# Patient Record
Sex: Female | Born: 1955 | Race: White | Hispanic: No | Smoking: Former smoker
Health system: Southern US, Community
[De-identification: ages and names within clinical notes are randomized; demographics above are authoritative.]

## PROBLEM LIST (undated history)

## (undated) DIAGNOSIS — I10 Essential (primary) hypertension: Secondary | ICD-10-CM

## (undated) DIAGNOSIS — R519 Headache, unspecified: Secondary | ICD-10-CM

## (undated) DIAGNOSIS — F419 Anxiety disorder, unspecified: Secondary | ICD-10-CM

## (undated) DIAGNOSIS — T7840XA Allergy, unspecified, initial encounter: Secondary | ICD-10-CM

## (undated) DIAGNOSIS — M810 Age-related osteoporosis without current pathological fracture: Secondary | ICD-10-CM

## (undated) DIAGNOSIS — F329 Major depressive disorder, single episode, unspecified: Secondary | ICD-10-CM

## (undated) DIAGNOSIS — M1990 Unspecified osteoarthritis, unspecified site: Secondary | ICD-10-CM

## (undated) DIAGNOSIS — E785 Hyperlipidemia, unspecified: Secondary | ICD-10-CM

## (undated) DIAGNOSIS — F32A Depression, unspecified: Secondary | ICD-10-CM

## (undated) HISTORY — DX: Headache, unspecified: R51.9

## (undated) HISTORY — DX: Depression, unspecified: F32.A

## (undated) HISTORY — PX: CEREBRAL MICROVASCULAR DECOMPRESSION: SHX1328

## (undated) HISTORY — PX: TRIGEMINAL NERVE DECOMPRESSION: SHX2579

## (undated) HISTORY — DX: Unspecified osteoarthritis, unspecified site: M19.90

## (undated) HISTORY — DX: Major depressive disorder, single episode, unspecified: F32.9

## (undated) HISTORY — PX: BRAIN SURGERY: SHX531

## (undated) HISTORY — PX: FOOT SURGERY: SHX648

## (undated) HISTORY — DX: Anxiety disorder, unspecified: F41.9

## (undated) HISTORY — DX: Hyperlipidemia, unspecified: E78.5

## (undated) HISTORY — DX: Age-related osteoporosis without current pathological fracture: M81.0

## (undated) HISTORY — DX: Essential (primary) hypertension: I10

## (undated) HISTORY — DX: Allergy, unspecified, initial encounter: T78.40XA

---

## 1999-01-28 ENCOUNTER — Other Ambulatory Visit: Admission: RE | Admit: 1999-01-28 | Discharge: 1999-01-28 | Payer: Self-pay | Admitting: Gynecology

## 2000-04-23 ENCOUNTER — Other Ambulatory Visit: Admission: RE | Admit: 2000-04-23 | Discharge: 2000-04-23 | Payer: Self-pay | Admitting: Gynecology

## 2001-01-01 ENCOUNTER — Ambulatory Visit (HOSPITAL_COMMUNITY): Admission: RE | Admit: 2001-01-01 | Discharge: 2001-01-01 | Payer: Self-pay | Admitting: Gynecology

## 2001-01-01 ENCOUNTER — Encounter (INDEPENDENT_AMBULATORY_CARE_PROVIDER_SITE_OTHER): Payer: Self-pay

## 2002-04-18 ENCOUNTER — Other Ambulatory Visit: Admission: RE | Admit: 2002-04-18 | Discharge: 2002-04-18 | Payer: Self-pay | Admitting: Gynecology

## 2003-04-25 ENCOUNTER — Other Ambulatory Visit: Admission: RE | Admit: 2003-04-25 | Discharge: 2003-04-25 | Payer: Self-pay | Admitting: Gynecology

## 2004-05-20 ENCOUNTER — Other Ambulatory Visit: Admission: RE | Admit: 2004-05-20 | Discharge: 2004-05-20 | Payer: Self-pay | Admitting: Gynecology

## 2004-06-13 ENCOUNTER — Ambulatory Visit (HOSPITAL_BASED_OUTPATIENT_CLINIC_OR_DEPARTMENT_OTHER): Admission: RE | Admit: 2004-06-13 | Discharge: 2004-06-13 | Payer: Self-pay | Admitting: Orthopedic Surgery

## 2009-05-13 ENCOUNTER — Encounter: Payer: Self-pay | Admitting: Internal Medicine

## 2009-05-25 ENCOUNTER — Ambulatory Visit: Payer: Self-pay | Admitting: Internal Medicine

## 2009-06-05 ENCOUNTER — Ambulatory Visit: Payer: Self-pay | Admitting: Internal Medicine

## 2009-06-05 HISTORY — PX: COLONOSCOPY: SHX174

## 2010-12-20 NOTE — Op Note (Signed)
[facility]  Patient:    Caitlyn Hill, Caitlyn Hill                           MRN: [id number]. Date: 01/01/01 Attending:  Luvenia Redden, M.D.                           Operative Report  PREOPERATIVE DIAGNOSIS:       Menometrorrhagia.  POSTOPERATIVE DIAGNOSES:      1. Menometrorrhagia.                               2. Endometrial polyp.                               3. Intracavitary myoma.  OPERATION:                    Hysteroscopy, dilation and curettage.  SURGEON:                      Luvenia Redden, M.D.  DESCRIPTION OF PROCEDURE:     Under good anesthesia, the patient was prepped and draped in a sterile manner.  The bladder was catheterized.  The uterus was anterior, upper limits of normal size, slightly irregular, no adnexal masses. The cervix was grasped with a tenaculum and the uterus sounded to a depth of 3-1/2 inches.  The cervix was dilated and the hysteroscope placed in the canal.  Using Sorbitol as a distending medium, the canal was examined and appeared to be normal.  On entering the endometrial cavity, an intracavitary mass could be seen.  It was smooth and round.  Both ostia were viewed once the scope could be passed beyond this mass that looked like a myoma.  There was blood clot superior to the myoma in the uterus.  The fundus and both ostia were viewed and appeared normal.  The myoma appeared to be broad based and was seen to be arising from the anterolateral wall on the patients left side. The scope was then removed.  Endocervical curettage was done, and this was sent as a separate specimen.  The cavity was explored with polyp forceps. Small amounts of tissue were obtained.  The endometrial cavity was scraped vigorously.  A small amount of tissue was obtained.  The cavity was wiped with a dry sponge and the procedure terminated.  Resection of the myoma was not attempted at this time due to its size and, also, due to the fact that she  had not been prepared with Lupron preoperatively.  Blood loss was approximately 25-30 cc.  Fluid deficit of Sorbitol was 30 cc.  The patient tolerated the procedure well and was removed to recovery in good condition. DD:  01/01/01 TD:  01/01/01 Job: [id number]

## 2010-12-20 NOTE — Op Note (Signed)
Caitlyn Hill, Caitlyn Hill                  ACCOUNT NO.:  [id number]   MEDICAL RECORD NO.:  [id number]          PATIENT TYPE:  AMB   LOCATION:  DSC                          FACILITY:  [facility]   PHYSICIAN:  Nadara Mustard, MD     DATE OF BIRTH:  Jun 02, 1956   DATE OF PROCEDURE:  06/13/2004  DATE OF DISCHARGE:                                 OPERATIVE REPORT   PREOPERATIVE DIAGNOSIS:  Bunionette deformity right fifth toe.   POSTOPERATIVE DIAGNOSIS:  Bunionette deformity right fifth toe.   PROCEDURE:  Chevron osteotomy right fifth metatarsal.   SURGEON:  Nadara Mustard, M.D.   ANESTHESIA:  LMA.   ESTIMATED BLOOD LOSS:  Minimal.   ANTIBIOTICS:  1 g of Kefzol.   TOURNIQUET TIME:  Esmarch to the ankle for approximately 18 minutes.   DISPOSITION:  To PACU in stable condition.   INDICATIONS FOR PROCEDURE:  The patient is a 55 year old woman with a  painful bunionette deformity of the right fifth toe.  The patient has failed  conservative care and presents at this time for surgical intervention.  The  risks and benefits were discussed, including infection, neurovascular  injury, persistent pain, need for additional surgery.  The patient states  she understands and wishes to proceed at this time.   DESCRIPTION OF PROCEDURE:  The patient was brought to O.R. room 2 and  underwent a general anesthetic.  After an adequate level of anesthesia was  obtained, the patient's right lower extremity was prepped using DuraPrep and  draped into a sterile field.  The leg was elevated.  An Esmarch was wrapped  around the ankle for tourniquet control.  A lateral longitudinal incision  was made over the MTP joint of the right fifth toe.  This was carried down  through the retinaculum, and an ostectomy was performed.  The patient then  underwent a Chevron osteotomy.  The metatarsal head was transferred  medially, and the osteotomy was then secured with 0.045 K-wire from proximal  dorsal to plantar distally.  A  second ostectomy was then again performed.  The wound was irrigated with normal saline.  The patient had had good  movement of the toe, and the osteotomy site was stable.  The fascia was  closed using 2-0 Vicryl.  The skin was closed using 3-0 nylon.  The wound  was covered with Adaptic orthopedic sponges,  sterile Webril, and a Coban dressing.  The patient was extubated and taken  to the PACU in stable condition.  Plan for discharge to home, touch-down  weightbearing with a postoperative shoe on the right.  Follow up in the  office in two weeks.      Vernia Buff   MVD/MEDQ  D:  06/13/2004  T:  06/13/2004  Job:  [id number]

## 2012-01-26 ENCOUNTER — Other Ambulatory Visit: Payer: Self-pay | Admitting: Internal Medicine

## 2012-01-26 NOTE — Telephone Encounter (Signed)
Dr. Merla Riches,  On 07/08/2011 you wrote this with RF x 1 year, knowing that it'd get capped at 6 months.  I suspect that you want to do the RF for the remaining, rather than me do just 30 days.

## 2012-01-27 NOTE — Telephone Encounter (Signed)
Yes that's the plan

## 2012-01-27 NOTE — Telephone Encounter (Signed)
Rx faxed in to pharmacy.

## 2012-07-28 ENCOUNTER — Other Ambulatory Visit: Payer: Self-pay | Admitting: Internal Medicine

## 2012-08-11 ENCOUNTER — Other Ambulatory Visit: Payer: Self-pay | Admitting: Internal Medicine

## 2012-08-12 NOTE — Telephone Encounter (Signed)
[id number] at nurse's station in [place].

## 2012-08-16 ENCOUNTER — Telehealth: Payer: Self-pay

## 2012-08-16 NOTE — Telephone Encounter (Signed)
Pharmacy wanted to see if pt can get a refill on pts Clonazepam 1 mg tablets. Please advise

## 2012-08-17 NOTE — Telephone Encounter (Signed)
No ov in epic/must have been > than 1 year so needs f/u unless extenuating circumstances

## 2012-08-17 NOTE — Telephone Encounter (Signed)
Called patient to advise she is due for follow up. Left mssg for her.

## 2012-08-18 ENCOUNTER — Telehealth: Payer: Self-pay

## 2012-08-18 NOTE — Telephone Encounter (Signed)
PT WAS TOLD [facility] WANTED HER TO COME BACK IN BEFORE HE WOULD REFILL HER KLONOPIN.  SHE WANTS TO KNOW IF HE WILL REFILL IT UNTIL "SHE GETS OBAMACARE."  [phone]

## 2012-08-21 NOTE — Telephone Encounter (Signed)
Can't/no OV w/in 1 year

## 2012-08-22 NOTE — Telephone Encounter (Signed)
Called pt, LMOM we cannot send in RX per Dr Merla Riches. CB if any questions.

## 2012-10-31 ENCOUNTER — Ambulatory Visit: Payer: Self-pay | Admitting: Internal Medicine

## 2012-10-31 VITALS — BP 130/79 | HR 72 | Temp 98.2°F | Resp 16 | Ht 67.0 in | Wt 140.0 lb

## 2012-10-31 DIAGNOSIS — R05 Cough: Secondary | ICD-10-CM

## 2012-10-31 DIAGNOSIS — F17200 Nicotine dependence, unspecified, uncomplicated: Secondary | ICD-10-CM | POA: Insufficient documentation

## 2012-10-31 DIAGNOSIS — R251 Tremor, unspecified: Secondary | ICD-10-CM | POA: Insufficient documentation

## 2012-10-31 DIAGNOSIS — F411 Generalized anxiety disorder: Secondary | ICD-10-CM | POA: Insufficient documentation

## 2012-10-31 DIAGNOSIS — G4700 Insomnia, unspecified: Secondary | ICD-10-CM

## 2012-10-31 DIAGNOSIS — Z8619 Personal history of other infectious and parasitic diseases: Secondary | ICD-10-CM | POA: Insufficient documentation

## 2012-10-31 DIAGNOSIS — R059 Cough, unspecified: Secondary | ICD-10-CM

## 2012-10-31 DIAGNOSIS — M19049 Primary osteoarthritis, unspecified hand: Secondary | ICD-10-CM | POA: Insufficient documentation

## 2012-10-31 MED ORDER — AZITHROMYCIN 250 MG PO TABS: ORAL_TABLET | ORAL | Status: DC

## 2012-10-31 MED ORDER — CLONAZEPAM 1 MG PO TABS: 1.0000 mg | ORAL_TABLET | Freq: Every evening | ORAL | Status: DC | PRN

## 2012-10-31 MED ORDER — PREDNISONE 20 MG PO TABS: ORAL_TABLET | ORAL | Status: DC

## 2012-10-31 NOTE — Progress Notes (Signed)
  Subjective:    Patient ID: Caitlyn Hill, female    DOB: September 22, 1955, 57 y.o.   MRN: [id number]  HPI she has had congestion for over a month with sinus drainage and cough particularly in the night and early morning No fever No recent weight loss  Also complaining of continued problems with insomnia secondary to anxiety C. long history in old chart/ has done well recently except for insomnia Has done well in the past with Klonopin//has been on Prozac for a year doing okay Continues to work cleaning houses  Continue smoking Continues to work on stress level  Past medical history-history of hepatitis C  Review of Systems No fever chills or night sweats No weight loss Chest pain or palpitations No GI symptoms    Objective:   Physical Exam Vital signs stable TMs clear Nares boggy with purulent mucus Throat clear No nodes Chest clear with mild wheezing on forced expiration Heart regular without murmur       Assessment & Plan:  Problem #1 sinusitis Problem #2 inflammatory cough Problem #3 insomnia secondary to anxiety Problem 4 history of tremor  Meds ordered this encounter  Medications  . azithromycin (ZITHROMAX) 250 MG tablet    Sig: As packaged    Dispense:  6 tablet    Refill:  0  . predniSONE (DELTASONE) 20 MG tablet    Sig: 3/3/2/2/1/1 single daily dose for 6 days    Dispense:  12 tablet    Refill:  0  . clonazePAM (KLONOPIN) 1 MG tablet    Sig: Take 1 tablet (1 mg total) by mouth at bedtime as needed for anxiety.    Dispense:  30 tablet    Refill:  5   Needs Government insurance May need followup hepatitis C

## 2012-10-31 NOTE — Progress Notes (Signed)
  Subjective:    Patient ID: Caitlyn Hill, female    DOB: Dec 25, 1955, 57 y.o.   MRN: [id number]  HPI  57 year old female c/o sinus infection x 1 month began with congestion and headaches, no problem breathing at night, cough does not create sob when moving.  laughing and blowing nose makes coughing worse. No history of wheezing. No penicillin allergy.  Has used z-pack in past with success.   Has trouble moving joints in in mornings in right hand. Takes ibuprofen for pain.    Review of Systems     Objective:   Physical Exam        Assessment & Plan:

## 2012-11-08 ENCOUNTER — Telehealth: Payer: Self-pay

## 2012-11-08 MED ORDER — CEFDINIR 300 MG PO CAPS: 300.0000 mg | ORAL_CAPSULE | Freq: Two times a day (BID) | ORAL | Status: DC

## 2012-11-08 NOTE — Telephone Encounter (Signed)
Still c/o sinuses, please advise, took z pack

## 2012-11-08 NOTE — Telephone Encounter (Signed)
Okay to treat with Omnicef Meds ordered this encounter  Medications  . cefdinir (OMNICEF) 300 MG capsule    Sig: Take 1 capsule (300 mg total) by mouth 2 (two) times daily.    Dispense:  20 capsule    Refill:  0

## 2012-11-08 NOTE — Telephone Encounter (Signed)
Called her, to advise this is sent in for her, and to try Mucinex and Zyrtec (or Claritin)

## 2012-11-08 NOTE — Telephone Encounter (Signed)
Patient was in with a sinus infection and is not better after taking her antibiotic would like to know if we could call in something else for her  [phone]

## 2012-11-10 ENCOUNTER — Telehealth: Payer: Self-pay | Admitting: Radiology

## 2012-11-10 ENCOUNTER — Telehealth: Payer: Self-pay | Admitting: Family Medicine

## 2012-11-10 MED ORDER — LEVOFLOXACIN 750 MG PO TABS: 750.0000 mg | ORAL_TABLET | Freq: Every day | ORAL | Status: DC

## 2012-11-10 NOTE — Telephone Encounter (Signed)
PATIENT STATES THAT SHE WAS PRESCRIBED AN ANTIBIOTIC THAT IS $86. WANTS TO KNOW IF THERE IS A CHEAPER ONE SHE CAN GET. PATIENT USES [facility]. [phone]

## 2012-11-10 NOTE — Telephone Encounter (Signed)
Patient advised another antibiotic has been sent in for her.

## 2012-11-10 NOTE — Telephone Encounter (Signed)
Patient called because the most current abx  (Levaquin) we sent in for her is over a hundred dollars and can not afford this. Looked through OV and previous messages. She was seen 10/31/12 by Dr. Merla Riches  And dx with sinusitis. Was treated with Zpak and prednisone. She called 11/08/12 stating not better and wanted another abx. We sent in Jackson. She called because that was 80 some dollars and could not afford. She requested a different abx. We sent in Levaquin and that is even more expensive. Still having HA, sinus pain, and pressure. She did have improvement after completing 1st abx but then 2 days after she finished her sxs started back. She has been using mucinex and zyrtec as directed. She would like a cheaper abx sent in. She said even if its the same one she was given at first. She just wants another round of abx but a cheap one. Please advise. Ph# [phone]

## 2012-11-10 NOTE — Telephone Encounter (Signed)
Sent Levaquin to pharmacy, hopefully this is less expensive

## 2012-11-10 NOTE — Telephone Encounter (Signed)
Patient wants to change the Omnicef to another antibiotic, can not afford the meds, please advise.

## 2012-11-11 ENCOUNTER — Telehealth: Payer: Self-pay

## 2012-11-11 MED ORDER — DOXYCYCLINE HYCLATE 100 MG PO CAPS: 100.0000 mg | ORAL_CAPSULE | Freq: Two times a day (BID) | ORAL | Status: DC

## 2012-11-11 NOTE — Telephone Encounter (Addendum)
[facility] WOULD LIKE TO SPEAK WITH SOMEONE REGARDING A CHANGE IN PT'S MEDICINE. PLEASE CALL [phone]    [facility] AT [phone]

## 2012-11-11 NOTE — Telephone Encounter (Signed)
Rx for doxycycline has been sent to pharmacy. She needs to continue mucinex and if her symptoms do not improve after this she will need to RTC for further evaluation.

## 2012-11-11 NOTE — Telephone Encounter (Signed)
Left message for her to advise.  

## 2012-11-11 NOTE — Telephone Encounter (Signed)
PATIENT STATES SHE IS GOING OUT OF TOWN IN A COUPLE OF HOURS.

## 2012-11-11 NOTE — Telephone Encounter (Signed)
Patient has allergy to PCN. Can not afford the Omnicef, also can not afford Levaquin. Please advise more cost effective ABX.

## 2013-02-12 ENCOUNTER — Other Ambulatory Visit: Payer: Self-pay | Admitting: Internal Medicine

## 2013-02-14 NOTE — Telephone Encounter (Signed)
5 refills plus rx given 10/31/12 good til 05/03/13

## 2013-03-30 ENCOUNTER — Telehealth: Payer: Self-pay

## 2013-03-30 MED ORDER — CLONAZEPAM 1 MG PO TABS: 1.0000 mg | ORAL_TABLET | Freq: Three times a day (TID) | ORAL | Status: DC | PRN

## 2013-03-30 NOTE — Telephone Encounter (Signed)
Dr. Merla Riches,   Patient just left her dentist and is in pain.  She is requesting valium to help with this.  The dentist could /would not write it for her.   Se also wants to increase  clonazePAM (KLONOPIN) 1 MG tablet to 3 tablets a day.  [phone]

## 2013-03-30 NOTE — Telephone Encounter (Signed)
Klonopin is same as valium May increase klonopin Should f/u to discuss before more

## 2013-03-31 ENCOUNTER — Other Ambulatory Visit: Payer: Self-pay | Admitting: Radiology

## 2013-03-31 NOTE — Telephone Encounter (Signed)
Patient advised return to clinic. Advised her also she should not use the valium/ klonopin together. She has already increased the klonopin

## 2013-10-29 ENCOUNTER — Encounter (HOSPITAL_COMMUNITY): Payer: Self-pay | Admitting: Emergency Medicine

## 2013-10-29 ENCOUNTER — Emergency Department (HOSPITAL_COMMUNITY): Payer: 59

## 2013-10-29 ENCOUNTER — Emergency Department (HOSPITAL_COMMUNITY)
Admission: EM | Admit: 2013-10-29 | Discharge: 2013-10-29 | Disposition: A | Discharge status: Home / Self Care | Payer: 59 | Attending: Emergency Medicine | Admitting: Emergency Medicine

## 2013-10-29 DIAGNOSIS — S060XAA Concussion with loss of consciousness status unknown, initial encounter: Secondary | ICD-10-CM

## 2013-10-29 DIAGNOSIS — Y9389 Activity, other specified: Secondary | ICD-10-CM | POA: Insufficient documentation

## 2013-10-29 DIAGNOSIS — S0010XA Contusion of unspecified eyelid and periocular area, initial encounter: Secondary | ICD-10-CM | POA: Insufficient documentation

## 2013-10-29 DIAGNOSIS — S060X0A Concussion without loss of consciousness, initial encounter: Secondary | ICD-10-CM | POA: Insufficient documentation

## 2013-10-29 DIAGNOSIS — F1010 Alcohol abuse, uncomplicated: Secondary | ICD-10-CM | POA: Insufficient documentation

## 2013-10-29 DIAGNOSIS — Y9289 Other specified places as the place of occurrence of the external cause: Secondary | ICD-10-CM | POA: Insufficient documentation

## 2013-10-29 DIAGNOSIS — Z87891 Personal history of nicotine dependence: Secondary | ICD-10-CM | POA: Insufficient documentation

## 2013-10-29 DIAGNOSIS — Z880 Allergy status to penicillin: Secondary | ICD-10-CM | POA: Insufficient documentation

## 2013-10-29 DIAGNOSIS — S060X9A Concussion with loss of consciousness of unspecified duration, initial encounter: Secondary | ICD-10-CM

## 2013-10-29 DIAGNOSIS — W1809XA Striking against other object with subsequent fall, initial encounter: Secondary | ICD-10-CM | POA: Insufficient documentation

## 2013-10-29 DIAGNOSIS — S298XXA Other specified injuries of thorax, initial encounter: Secondary | ICD-10-CM | POA: Insufficient documentation

## 2013-10-29 DIAGNOSIS — Z8659 Personal history of other mental and behavioral disorders: Secondary | ICD-10-CM | POA: Insufficient documentation

## 2013-10-29 NOTE — ED Notes (Signed)
Pt presents with c/o head injury and blurred vision. Pt says that she isn't entirely sure of what happened but she believes that she hit her head on a door and then possibly fell Thursday night. Pt also c/o left rib cage pain. Pt says that she had some wine and took some sleeping pills and then went to bed and the next morning woke up with the pain in the left side of her head and rib cage area. Pt does have a small red area to the left side of her forehead. Pt says her vision is blurred at this time and has been this way since she woke up yesterday morning.

## 2013-10-29 NOTE — ED Notes (Signed)
Pt reports seeing "some what" double.  She also c/o of left rib pain from the fall.

## 2013-10-29 NOTE — ED Provider Notes (Signed)
CSN: [id number]     Arrival date & time 10/29/13  1554 History   First MD Initiated Contact with Patient 10/29/13 1628     Chief Complaint  Patient presents with  . Head Injury  . Blurred Vision    HPI The patient presents to emergency room with complaints of head injury and blurred vision. Patient states she had a few glasses of wine 2 nights ago. Not enough for her to feel intoxicated but then she took a sleeping pill before going to bed. Patient does not recall falling that  night but when she woke up this morning she noticed bruising around her eye and some pain in her left rib area. Patient states she now feels like she has some blurred vision. Most notably when she is looking downward. She denies any headache. She denies any nausea or vomiting. She denies any trouble with her coordination or balance. She denies any slurred speech. She denies any numbness or weakness. Past Medical History  Diagnosis Date  . Depression   . Anxiety    Past Surgical History  Procedure Laterality Date  . Cesarean section     Family History  Problem Relation Age of Onset  . Cancer Father     lung   History  Substance Use Topics  . Smoking status: Former Games developermoker  . Smokeless tobacco: Not on file  . Alcohol Use: Yes     Comment: occasionally    OB History   Grav Para Term Preterm Abortions TAB SAB Ect Mult Living                 Review of Systems  All other systems reviewed and are negative.      Allergies  Penicillins  Home Medications   Current Outpatient Rx  Name  Route  Sig  Dispense  Refill  . Multiple Vitamin (MULTIVITAMIN WITH MINERALS) TABS tablet   Oral   Take 1 tablet by mouth every morning.          BP 171/72  Pulse 52  Temp(Src) 98.2 F (36.8 C) (Oral)  Resp 16  SpO2 98% Physical Exam  Nursing note and vitals reviewed. Constitutional: She is oriented to person, place, and time. She appears well-developed and well-nourished. No distress.  HENT:  Head:  Normocephalic and atraumatic.  Right Ear: External ear normal.  Left Ear: External ear normal.  Mouth/Throat: Oropharynx is clear and moist.  Eyes: Conjunctivae and EOM are normal. Pupils are equal, round, and reactive to light. Right eye exhibits no chemosis and no discharge. Left eye exhibits no chemosis and no discharge. Right conjunctiva is not injected. Right conjunctiva has no hemorrhage. Left conjunctiva is not injected. Left conjunctiva has no hemorrhage. No scleral icterus.  Slit lamp exam:      The right eye shows no hyphema.       The left eye shows no hyphema.  Small amount of ecchymoses left periorbital region  Neck: Neck supple. No tracheal deviation present.  Cardiovascular: Normal rate, regular rhythm and intact distal pulses.   Pulmonary/Chest: Effort normal and breath sounds normal. No stridor. No respiratory distress. She has no wheezes. She has no rales. She exhibits tenderness (mild left-sided). She exhibits no mass, no crepitus and no deformity.  Abdominal: Soft. Bowel sounds are normal. She exhibits no distension. There is no tenderness. There is no rebound and no guarding.  Musculoskeletal: She exhibits no edema and no tenderness.       Cervical back: Normal.  Thoracic back: Normal.       Lumbar back: Normal.  Neurological: She is alert and oriented to person, place, and time. She has normal strength. No cranial nerve deficit (no facial droop, extraocular movements intact, no slurred speech) or sensory deficit. She exhibits normal muscle tone. She displays no seizure activity. Coordination normal.  No pronator drift bilateral upper extrem, able to hold both legs off bed for 5 seconds, sensation intact in all extremities, no visual field cuts, no left or right sided neglect, normal finger-nose exam bilaterally, no nystagmus noted   Skin: Skin is warm and dry. No rash noted.  Psychiatric: She has a normal mood and affect.    ED Course  Procedures (including  critical care time) Labs Review Labs Reviewed - No data to display Imaging Review Ct Head Wo Contrast  10/29/2013   CLINICAL DATA:  Blurred vision.  Fall.  Bruising around left orbit.  EXAM: CT HEAD WITHOUT CONTRAST  TECHNIQUE: Contiguous axial images were obtained from the base of the skull through the vertex without intravenous contrast.  COMPARISON:  None.  FINDINGS: No acute intracranial abnormality. Specifically, no hemorrhage, hydrocephalus, mass lesion, acute infarction, or significant intracranial injury. No acute calvarial abnormality. Visualized paranasal sinuses and mastoids clear. Orbital soft tissues unremarkable.  IMPRESSION: No acute intracranial abnormality.   Electronically Signed   By: Charlett Nose M.D.   On: 10/29/2013 17:38     EKG Interpretation None      MDM   Final diagnoses:  Concussion    No evidence of serious injury. Likely related to concussion.  Will have her follow up with ophtho    Celene Kras, MD 10/30/13 [phone]

## 2013-10-29 NOTE — ED Notes (Signed)
MD at bedside. 

## 2013-10-29 NOTE — Discharge Instructions (Signed)

## 2013-12-14 ENCOUNTER — Ambulatory Visit: Payer: Self-pay | Admitting: Emergency Medicine

## 2013-12-14 VITALS — BP 122/74 | HR 75 | Temp 98.0°F | Resp 17 | Ht 67.0 in | Wt 141.0 lb

## 2013-12-14 DIAGNOSIS — R300 Dysuria: Secondary | ICD-10-CM

## 2013-12-14 DIAGNOSIS — G4700 Insomnia, unspecified: Secondary | ICD-10-CM

## 2013-12-14 DIAGNOSIS — N3000 Acute cystitis without hematuria: Secondary | ICD-10-CM

## 2013-12-14 LAB — POCT UA - MICROSCOPIC ONLY
CASTS, UR, LPF, POC: NEGATIVE
Crystals, Ur, HPF, POC: NEGATIVE
MUCUS UA: POSITIVE
Yeast, UA: NEGATIVE

## 2013-12-14 LAB — POCT URINALYSIS DIPSTICK
Bilirubin, UA: NEGATIVE
Glucose, UA: NEGATIVE
Nitrite, UA: NEGATIVE
PH UA: 5.5
Protein, UA: 100
Spec Grav, UA: >=1.03
Urobilinogen, UA: 0.2

## 2013-12-14 MED ORDER — CIPROFLOXACIN HCL 500 MG PO TABS: 500.0000 mg | ORAL_TABLET | Freq: Two times a day (BID) | ORAL | Status: DC

## 2013-12-14 MED ORDER — PHENAZOPYRIDINE HCL 200 MG PO TABS: 200.0000 mg | ORAL_TABLET | Freq: Three times a day (TID) | ORAL | Status: DC | PRN

## 2013-12-14 MED ORDER — TEMAZEPAM 15 MG PO CAPS: 15.0000 mg | ORAL_CAPSULE | Freq: Every evening | ORAL | Status: DC | PRN

## 2013-12-14 NOTE — Progress Notes (Addendum)
[facility] [address], [place] [address] [phone]  Date:  12/14/2013   Name:  Caitlyn Hill   DOB:  Dec 29, 1955   MRN:  [id number]  PCP:  Tonye PearsonOLITTLE, ROBERT P, MD    Chief Complaint: Dysuria and pain with urination   History of Present Illness:  Caitlyn Hill is a 58 y.o. very pleasant female patient who presents with the following:  Ill with burning with urination and frequency.  Has blood  In urine.  Started yesterday.  No abdominal or back pain.  No fever or chills.. No nausea or vomiting.  No stool change.  No gyn symptoms. Has difficulty falling asleep despite OTC remedies.  Has been a problem for a year.  Now wakens at 0300 and cannot fall back to sleep.  Many personal pressures that she cannot remedy. No improvement with over the counter medications or other home remedies. Denies other complaint or health concern today.   Patient Active Problem List   Diagnosis Date Noted  . Insomnia 10/31/2012  . Nicotine addiction 10/31/2012  . Osteoarthritis of hands 10/31/2012  . History of hepatitis C 10/31/2012  . Tremor 10/31/2012  . Generalized anxiety disorder 10/31/2012    Past Medical History  Diagnosis Date  . Depression   . Anxiety     Past Surgical History  Procedure Laterality Date  . Cesarean section      History  Substance Use Topics  . Smoking status: Former Games developermoker  . Smokeless tobacco: Not on file  . Alcohol Use: Yes     Comment: occasionally     Family History  Problem Relation Age of Onset  . Cancer Father     lung    Allergies  Allergen Reactions  . Penicillins Hives    Medication list has been reviewed and updated.  Current Outpatient Prescriptions on File Prior to Visit  Medication Sig Dispense Refill  . Multiple Vitamin (MULTIVITAMIN WITH MINERALS) TABS tablet Take 1 tablet by mouth every morning.       No current facility-administered medications on file prior to visit.    Review of Systems:  As per HPI,  otherwise negative.    Physical Examination: Filed Vitals:   12/14/13 0816  BP: 122/74  Pulse: 75  Temp: 98 F (36.7 C)  Resp: 17   Filed Vitals:   12/14/13 0816  Height: 5\' 7"  (1.702 m)  Weight: 141 lb (63.957 kg)   Body mass index is 22.08 kg/(m^2). Ideal Body Weight: Weight in (lb) to have BMI = 25: 159.3  GEN: WDWN, NAD, Non-toxic, A & O x 3 HEENT: Atraumatic, Normocephalic. Neck supple. No masses, No LAD. Ears and Nose: No external deformity. CV: RRR, No M/G/R. No JVD. No thrill. No extra heart sounds. PULM: CTA B, no wheezes, crackles, rhonchi. No retractions. No resp. distress. No accessory muscle use. ABD: S, NT, ND, +BS. No rebound. No HSM. EXTR: No c/c/e NEURO Normal gait.  PSYCH: Normally interactive. Conversant. Not depressed or anxious appearing.  Calm demeanor.    Assessment and Plan: Acute cystitis cipro Pyridium Insomnia restoril   Signed,  Phillips OdorJeffery Satcha Storlie, MD   Results for orders placed in visit on 12/14/13  POCT UA - MICROSCOPIC ONLY      Result Value Ref Range   WBC, Ur, HPF, POC TNTC     RBC, urine, microscopic TNTC     Bacteria, U Microscopic 1+     Mucus, UA positive  Epithelial cells, urine per micros 3-5     Crystals, Ur, HPF, POC neg     Casts, Ur, LPF, POC neg     Yeast, UA neg    POCT URINALYSIS DIPSTICK      Result Value Ref Range   Color, UA yellow     Clarity, UA sl cloudy     Glucose, UA neg     Bilirubin, UA neg     Ketones, UA trace     Spec Grav, UA >=1.030     Blood, UA large     pH, UA 5.5     Protein, UA 100     Urobilinogen, UA 0.2     Nitrite, UA neg     Leukocytes, UA Trace

## 2013-12-14 NOTE — Addendum Note (Signed)
Addended by: Carmelina DaneANDERSON, Rolinda Impson S on: 12/14/2013 08:41 AM   Modules accepted: Orders

## 2013-12-14 NOTE — Patient Instructions (Signed)
Urinary Tract Infection  Urinary tract infections (UTIs) can develop anywhere along your urinary tract. Your urinary tract is your body's drainage system for removing wastes and extra water. Your urinary tract includes two kidneys, two ureters, a bladder, and a urethra. Your kidneys are a pair of bean-shaped organs. Each kidney is about the size of your fist. They are located below your ribs, one on each side of your spine.  CAUSES  Infections are caused by microbes, which are microscopic organisms, including fungi, viruses, and bacteria. These organisms are so small that they can only be seen through a microscope. Bacteria are the microbes that most commonly cause UTIs.  SYMPTOMS   Symptoms of UTIs may vary by age and gender of the patient and by the location of the infection. Symptoms in young women typically include a frequent and intense urge to urinate and a painful, burning feeling in the bladder or urethra during urination. Older women and men are more likely to be tired, shaky, and weak and have muscle aches and abdominal pain. A fever may mean the infection is in your kidneys. Other symptoms of a kidney infection include pain in your back or sides below the ribs, nausea, and vomiting.  DIAGNOSIS  To diagnose a UTI, your caregiver will ask you about your symptoms. Your caregiver also will ask to provide a urine sample. The urine sample will be tested for bacteria and white blood cells. White blood cells are made by your body to help fight infection.  TREATMENT   Typically, UTIs can be treated with medication. Because most UTIs are caused by a bacterial infection, they usually can be treated with the use of antibiotics. The choice of antibiotic and length of treatment depend on your symptoms and the type of bacteria causing your infection.  HOME CARE INSTRUCTIONS   If you were prescribed antibiotics, take them exactly as your caregiver instructs you. Finish the medication even if you feel better after you  have only taken some of the medication.   Drink enough water and fluids to keep your urine clear or pale yellow.   Avoid caffeine, tea, and carbonated beverages. They tend to irritate your bladder.   Empty your bladder often. Avoid holding urine for long periods of time.   Empty your bladder before and after sexual intercourse.   After a bowel movement, women should cleanse from front to back. Use each tissue only once.  SEEK MEDICAL CARE IF:    You have back pain.   You develop a fever.   Your symptoms do not begin to resolve within 3 days.  SEEK IMMEDIATE MEDICAL CARE IF:    You have severe back pain or lower abdominal pain.   You develop chills.   You have nausea or vomiting.   You have continued burning or discomfort with urination.  MAKE SURE YOU:    Understand these instructions.   Will watch your condition.   Will get help right away if you are not doing well or get worse.  Document Released: 04/30/2005 Document Revised: 01/20/2012 Document Reviewed: 08/29/2011  ExitCare Patient Information 2014 [employer].

## 2013-12-15 ENCOUNTER — Telehealth: Payer: Self-pay

## 2013-12-15 MED ORDER — SULFAMETHOXAZOLE-TRIMETHOPRIM 800-160 MG PO TABS: 1.0000 | ORAL_TABLET | Freq: Two times a day (BID) | ORAL | Status: DC

## 2013-12-15 MED ORDER — PHENAZOPYRIDINE HCL 200 MG PO TABS: 200.0000 mg | ORAL_TABLET | Freq: Three times a day (TID) | ORAL | Status: DC | PRN

## 2013-12-15 NOTE — Telephone Encounter (Signed)
Pt stated that she is feeling worse that she is now having pain in her back.  Per Dr. Dareen PianoAnderson sent in [place] and refilled her pyridium

## 2013-12-15 NOTE — Telephone Encounter (Signed)
Pt has a question about the UTI medicine she was prescribed by Dr. Dareen PianoAnderson yesterday, she is concerned she may have a kidney infection. [phone]

## 2013-12-21 ENCOUNTER — Telehealth: Payer: Self-pay

## 2013-12-21 NOTE — Telephone Encounter (Signed)
Symptoms persist (dysuria and frequency). Seen here 12/14/2013. Initially prescribed Cipro. Changed to Septra due to development of "kidney pain."  Chart reviewed. No culture sent. Advised patient to RTC for re-evaluation.

## 2013-12-21 NOTE — Telephone Encounter (Signed)
Not a patient I know so----- "Sorry--that's not the way I like to do it" Maybe she should try walkin for u/a--we won't treat this over the phone

## 2013-12-21 NOTE — Telephone Encounter (Signed)
Dr Merla Richesoolittle   Patient wants only you to call her.  She has an UTI. She does not want a nurse to call her.   [phone]

## 2014-04-01 ENCOUNTER — Encounter: Payer: Self-pay | Admitting: Internal Medicine

## 2014-05-27 ENCOUNTER — Other Ambulatory Visit: Payer: Self-pay | Admitting: Emergency Medicine

## 2015-01-15 ENCOUNTER — Encounter: Payer: Self-pay | Admitting: *Deleted

## 2015-07-02 ENCOUNTER — Encounter: Payer: Self-pay | Admitting: Internal Medicine

## 2018-03-24 DIAGNOSIS — G500 Trigeminal neuralgia: Secondary | ICD-10-CM | POA: Insufficient documentation

## 2018-09-03 DIAGNOSIS — Z01818 Encounter for other preprocedural examination: Secondary | ICD-10-CM | POA: Diagnosis not present

## 2018-09-03 DIAGNOSIS — G500 Trigeminal neuralgia: Secondary | ICD-10-CM | POA: Diagnosis not present

## 2018-09-06 DIAGNOSIS — M542 Cervicalgia: Secondary | ICD-10-CM | POA: Diagnosis not present

## 2018-09-06 DIAGNOSIS — Z87891 Personal history of nicotine dependence: Secondary | ICD-10-CM | POA: Diagnosis not present

## 2018-09-06 DIAGNOSIS — R69 Illness, unspecified: Secondary | ICD-10-CM | POA: Diagnosis not present

## 2018-09-06 DIAGNOSIS — Z48811 Encounter for surgical aftercare following surgery on the nervous system: Secondary | ICD-10-CM | POA: Diagnosis not present

## 2018-09-06 DIAGNOSIS — G500 Trigeminal neuralgia: Secondary | ICD-10-CM | POA: Diagnosis not present

## 2018-09-06 DIAGNOSIS — Z801 Family history of malignant neoplasm of trachea, bronchus and lung: Secondary | ICD-10-CM | POA: Diagnosis not present

## 2018-09-06 DIAGNOSIS — Z8249 Family history of ischemic heart disease and other diseases of the circulatory system: Secondary | ICD-10-CM | POA: Diagnosis not present

## 2018-09-07 DIAGNOSIS — Z48811 Encounter for surgical aftercare following surgery on the nervous system: Secondary | ICD-10-CM | POA: Diagnosis not present

## 2018-09-07 DIAGNOSIS — R69 Illness, unspecified: Secondary | ICD-10-CM | POA: Diagnosis not present

## 2018-09-07 DIAGNOSIS — Z87891 Personal history of nicotine dependence: Secondary | ICD-10-CM | POA: Diagnosis not present

## 2018-09-07 DIAGNOSIS — G500 Trigeminal neuralgia: Secondary | ICD-10-CM | POA: Diagnosis not present

## 2018-09-16 DIAGNOSIS — Z4802 Encounter for removal of sutures: Secondary | ICD-10-CM | POA: Diagnosis not present

## 2018-09-16 DIAGNOSIS — Z8669 Personal history of other diseases of the nervous system and sense organs: Secondary | ICD-10-CM | POA: Diagnosis not present

## 2018-10-06 DIAGNOSIS — G500 Trigeminal neuralgia: Secondary | ICD-10-CM | POA: Diagnosis not present

## 2018-10-06 DIAGNOSIS — Z48811 Encounter for surgical aftercare following surgery on the nervous system: Secondary | ICD-10-CM | POA: Diagnosis not present

## 2018-12-01 DIAGNOSIS — I6781 Acute cerebrovascular insufficiency: Secondary | ICD-10-CM | POA: Diagnosis not present

## 2018-12-01 DIAGNOSIS — G500 Trigeminal neuralgia: Secondary | ICD-10-CM | POA: Diagnosis not present

## 2018-12-17 DIAGNOSIS — T8149XD Infection following a procedure, other surgical site, subsequent encounter: Secondary | ICD-10-CM | POA: Diagnosis not present

## 2018-12-17 DIAGNOSIS — Z8669 Personal history of other diseases of the nervous system and sense organs: Secondary | ICD-10-CM | POA: Diagnosis not present

## 2018-12-19 DIAGNOSIS — G500 Trigeminal neuralgia: Secondary | ICD-10-CM | POA: Diagnosis not present

## 2018-12-19 DIAGNOSIS — T8189XA Other complications of procedures, not elsewhere classified, initial encounter: Secondary | ICD-10-CM | POA: Diagnosis not present

## 2018-12-19 DIAGNOSIS — Z1159 Encounter for screening for other viral diseases: Secondary | ICD-10-CM | POA: Diagnosis not present

## 2018-12-19 DIAGNOSIS — Y848 Other medical procedures as the cause of abnormal reaction of the patient, or of later complication, without mention of misadventure at the time of the procedure: Secondary | ICD-10-CM | POA: Diagnosis not present

## 2018-12-19 DIAGNOSIS — T8141XA Infection following a procedure, superficial incisional surgical site, initial encounter: Secondary | ICD-10-CM | POA: Diagnosis not present

## 2018-12-19 DIAGNOSIS — G9059 Complex regional pain syndrome I of other specified site: Secondary | ICD-10-CM | POA: Diagnosis not present

## 2019-01-05 DIAGNOSIS — Z8669 Personal history of other diseases of the nervous system and sense organs: Secondary | ICD-10-CM | POA: Diagnosis not present

## 2019-01-05 DIAGNOSIS — Z4802 Encounter for removal of sutures: Secondary | ICD-10-CM | POA: Diagnosis not present

## 2019-03-11 ENCOUNTER — Other Ambulatory Visit: Payer: Self-pay

## 2019-03-14 ENCOUNTER — Other Ambulatory Visit: Payer: Self-pay | Admitting: Family Medicine

## 2019-03-14 ENCOUNTER — Ambulatory Visit (INDEPENDENT_AMBULATORY_CARE_PROVIDER_SITE_OTHER): Payer: Medicare HMO | Admitting: Family Medicine

## 2019-03-14 ENCOUNTER — Encounter: Payer: Self-pay | Admitting: Family Medicine

## 2019-03-14 VITALS — BP 140/82 | HR 77 | Temp 98.3°F | Ht 66.0 in | Wt 144.6 lb

## 2019-03-14 DIAGNOSIS — R69 Illness, unspecified: Secondary | ICD-10-CM | POA: Diagnosis not present

## 2019-03-14 DIAGNOSIS — M7061 Trochanteric bursitis, right hip: Secondary | ICD-10-CM

## 2019-03-14 DIAGNOSIS — F5101 Primary insomnia: Secondary | ICD-10-CM

## 2019-03-14 DIAGNOSIS — G44201 Tension-type headache, unspecified, intractable: Secondary | ICD-10-CM | POA: Diagnosis not present

## 2019-03-14 DIAGNOSIS — Z8669 Personal history of other diseases of the nervous system and sense organs: Secondary | ICD-10-CM

## 2019-03-14 MED ORDER — TIZANIDINE HCL 4 MG PO TABS: 4.0000 mg | ORAL_TABLET | Freq: Four times a day (QID) | ORAL | 0 refills | Status: DC | PRN

## 2019-03-14 MED ORDER — TRAZODONE HCL 50 MG PO TABS: 50.0000 mg | ORAL_TABLET | Freq: Every evening | ORAL | 3 refills | Status: DC | PRN

## 2019-03-14 MED ORDER — MELOXICAM 15 MG PO TABS: 15.0000 mg | ORAL_TABLET | Freq: Every day | ORAL | 0 refills | Status: DC

## 2019-03-14 MED ORDER — KETOROLAC TROMETHAMINE 60 MG/2ML IM SOLN
60.0000 mg | Freq: Once | INTRAMUSCULAR | Status: AC
  Administered 2019-03-14: 16:00:00 60 mg via INTRAMUSCULAR

## 2019-03-14 NOTE — Assessment & Plan Note (Signed)
-  Headaches seem to be tension related. Given toradol today.   -I recommended that she see neurology given complicated history with trigeminal neuralgia as well.  -Recommend d/c opioids as these may be creating rebound headache situation.   -I discussed with her that I would not provide controlled medications for management of her headaches.

## 2019-03-14 NOTE — Patient Instructions (Signed)
I would recommend stopping opioid pain medications as this can cause "rebound" headaches and cause your headaches to occur more frequently.  I have ordered a referral to neurology.  I would like to see you back in 3 months.   Cervicogenic Headache  A cervicogenic headache is a headache caused by a condition that affects the bones and tissues in your neck (cervical spine). In a cervicogenic headache, the pain moves from your neck to your head. Most cervicogenic headaches start in the upper part of the neck with the first three cervical bones (cervical vertebrae). A cervicogenic headache is diagnosed when a cause can be found in the cervical spine and other causes of headaches can be ruled out. What are the causes? The most common cause of this condition is a traumatic injury to the cervical spine, such as whiplash. Other causes include:  Arthritis.  Broken bone (fracture).  Infection.  Tumor. What are the signs or symptoms? The most common symptoms are neck and head pain. The pain is often located on one side. In some cases, there may be head pain without neck pain. Pain may be felt in the neck, back or side of the head, face, or behind the eyes. Other symptoms include:  Limited movement in the neck.  Arm or shoulder pain. How is this diagnosed? This condition may be diagnosed based on:  Your symptoms.  A physical exam.  An injection that blocks nerve signals (nerve block).  Imaging tests, such as: ? X-rays. ? CT scan. ? MRI. How is this treated? Treatment for this condition may depend on the underlying condition. Treatment may include:  Medicines, such as: ? NSAIDs. ? Muscle relaxants.  Physical therapy.  Massage therapy.  Complementary therapies, such as: ? Biofeedback. ? Meditation. ? Acupuncture.  Nerve block injections.  Botulinum toxin injections. Your treatment plan may involve working with a pain management team that includes your primary health care  provider, a pain management specialist, a neurologist, and a physical therapist. Follow these instructions at home:  Take over-the-counter and prescription medicines only as told by your health care provider.  Do exercises at home as told by your physical therapist.  Return to your normal activities as told by your health care provider. Ask your health care provider what activities are safe for you. Avoid activities that trigger your headaches.  Maintain good neck support and posture at home and at work.  Keep all follow-up visits as told by your health care provider. This is important. Contact a health care provider if you have:  Headaches that are getting worse and happening more often.  Headaches with any of the following: ? Fever. ? Numbness. ? Weakness. ? Dizziness. ? Nausea or vomiting. Get help right away if:  You have a very sudden and severe headache. Summary  A cervicogenic headache is a headache caused by a condition that affects the bones and tissues in your cervical spine.  Your health care provider may diagnose this condition with a physical exam, a nerve block, and imaging tests.  Treatment may include medicine to reduce pain and inflammation, physical therapy, and nerve block injections.  Complementary therapies, such as acupuncture and meditation, may be added to other treatments.  Your treatment plan may involve working with a pain management team that includes your primary health care provider, a pain management specialist, a neurologist, and a physical therapist. This information is not intended to replace advice given to you by your health care provider. Make sure you discuss  any questions you have with your health care provider. Document Released: 10/11/2003 Document Revised: 11/10/2018 Document Reviewed: 07/31/2017 Elsevier Patient Education  2020 [employer].

## 2019-03-14 NOTE — Progress Notes (Signed)
Caitlyn Hill - 63 y.o. female MRN [id number]  Date of birth: 07-17-1956  Subjective Chief Complaint  Patient presents with  . Establish Care    est care/ headache consult?/ right thigh to hip pain/ mark on left shoulder    HPI Caitlyn Hill is a 63 y.o. female here today for initial visit.  She has history of trigeminal neuralgia with history of microvascular decompression earlier this year.  This was complicated by post-op infection but has resolved at this point.  She reports that trigeminal pain has improved but she continues to have L sided headaches.  She reports that she is managing these by buying oxycodone from friends.  She also reports that she has tried some flexeril but doesn't think it did a whole lot for her.  She denies associated nausea, light or sound sensitivity, vision or hearing changes associated with her headaches.    She also complains of R hip pain.  She reports that this has been ongoing for about 3 months.  Pain is located on lateral hip. Denies radiation of pain, numbness or tingling.  Pain is worse with walking.   Reports insomnia.  States that [place] works best for her.  Has never had rx for ambien but has taken a some of her friends.  Reports that "pm" medications such as tylenol PM "hype her up" and melatonin has no effect.    ROS:  A comprehensive ROS was completed and negative except as noted per HPI  Allergies  Allergen Reactions  . Penicillins Hives    Past Medical History:  Diagnosis Date  . Anxiety   . Depression     Past Surgical History:  Procedure Laterality Date  . CEREBRAL MICROVASCULAR DECOMPRESSION    . CESAREAN SECTION    . FOOT SURGERY      Social History   Socioeconomic History  . Marital status: Divorced    Spouse name: Not on file  . Number of children: Not on file  . Years of education: Not on file  . Highest education level: Not on file  Occupational History  . Not on file  Social Needs  . Financial resource strain: Not  on file  . Food insecurity    Worry: Not on file    Inability: Not on file  . Transportation needs    Medical: Not on file    Non-medical: Not on file  Tobacco Use  . Smoking status: Former Games developermoker  . Smokeless tobacco: Never Used  Substance and Sexual Activity  . Alcohol use: Yes    Comment: occasionally   . Drug use: No  . Sexual activity: Yes    Birth control/protection: None  Lifestyle  . Physical activity    Days per week: Not on file    Minutes per session: Not on file  . Stress: Not on file  Relationships  . Social Musicianconnections    Talks on phone: Not on file    Gets together: Not on file    Attends religious service: Not on file    Active member of club or organization: Not on file    Attends meetings of clubs or organizations: Not on file    Relationship status: Not on file  Other Topics Concern  . Not on file  Social History Narrative  . Not on file    Family History  Problem Relation Age of Onset  . Cancer Father        lung    Health Maintenance  Topic Date Due  . HIV Screening  08/21/1968  . TETANUS/TDAP  08/21/1972  . PAP SMEAR-Modifier  08/21/1974  . MAMMOGRAM  08/22/2003  . DEXA SCAN  08/21/2018  . PNA vac Low Risk Adult (1 of 2 - PCV13) 08/21/2018  . INFLUENZA VACCINE  03/05/2019  . COLONOSCOPY  06/06/2019  . Hepatitis C Screening  Completed    ----------------------------------------------------------------------------------------------------------------------------------------------------------------------------------------------------------------- Physical Exam BP 140/82   Pulse 77   Temp 98.3 F (36.8 C) (Oral)   Ht 5\' 6"  (1.676 m)   Wt 144 lb 9.6 oz (65.6 kg)   SpO2 98%   BMI 23.34 kg/m   Physical Exam Constitutional:      Appearance: Normal appearance.  HENT:     Head: Normocephalic and atraumatic.  Eyes:     General: No scleral icterus. Neck:     Comments: Tightness of upper trapezius on L. ROM of neck is normal.    Cardiovascular:     Rate and Rhythm: Normal rate and regular rhythm.  Pulmonary:     Effort: Pulmonary effort is normal.     Breath sounds: Normal breath sounds.  Musculoskeletal:     Comments: ROM of hip is normal with normal IR and ER and normal flexion/extension.  TTP along greater trochanter.    Skin:    General: Skin is warm and dry.     Findings: No rash.  Neurological:     General: No focal deficit present.     Mental Status: She is alert and oriented to person, place, and time.     Cranial Nerves: No cranial nerve deficit.  Psychiatric:        Mood and Affect: Mood normal.        Behavior: Behavior normal.     ------------------------------------------------------------------------------------------------------------------------------------------------------------------------------------------------------------------- Assessment and Plan  Acute intractable tension-type headache -Headaches seem to be tension related. Given toradol today.   -I recommended that she see neurology given complicated history with trigeminal neuralgia as well.  -Recommend d/c opioids as these may be creating rebound headache situation.   -I discussed with her that I would not provide controlled medications for management of her headaches.     Insomnia -Discussed that I would not recommend ambien especially as she has no intention of discontinuing opioids.  Recommend trazodone instead, she states she is willing to try.   Trochanteric bursitis of right hip Toradol injection given today, will start meloxicam daily.  Recommend daily icing.

## 2019-03-14 NOTE — Assessment & Plan Note (Signed)
-  Discussed that I would not recommend ambien especially as she has no intention of discontinuing opioids.  Recommend trazodone instead, she states she is willing to try.

## 2019-03-14 NOTE — Assessment & Plan Note (Signed)
Toradol injection given today, will start meloxicam daily.  Recommend daily icing.

## 2019-03-21 ENCOUNTER — Telehealth: Payer: Self-pay | Admitting: Family Medicine

## 2019-03-21 NOTE — Telephone Encounter (Signed)
I called and spoke to patient. Patient informed referral has been sent over [place] and they will contact her.

## 2019-03-21 NOTE — Telephone Encounter (Signed)
Pt called to check up on referral that Dr. Zigmund Daniel placed on 03/17/2019 to neurology appt within 2 wks per Dr.Matthews's note. Please give pt a call back.

## 2019-03-29 ENCOUNTER — Encounter: Payer: Self-pay | Admitting: Neurology

## 2019-03-29 ENCOUNTER — Telehealth (INDEPENDENT_AMBULATORY_CARE_PROVIDER_SITE_OTHER): Payer: Medicare HMO | Admitting: Family Medicine

## 2019-03-29 ENCOUNTER — Other Ambulatory Visit: Payer: Self-pay

## 2019-03-29 ENCOUNTER — Encounter: Payer: Self-pay | Admitting: Family Medicine

## 2019-03-29 ENCOUNTER — Ambulatory Visit: Payer: Medicare HMO | Admitting: Neurology

## 2019-03-29 ENCOUNTER — Telehealth: Payer: Self-pay | Admitting: Neurology

## 2019-03-29 VITALS — BP 151/83 | HR 57 | Temp 97.5°F | Ht 65.0 in | Wt 145.0 lb

## 2019-03-29 DIAGNOSIS — R51 Headache: Secondary | ICD-10-CM | POA: Diagnosis not present

## 2019-03-29 DIAGNOSIS — R69 Illness, unspecified: Secondary | ICD-10-CM | POA: Diagnosis not present

## 2019-03-29 DIAGNOSIS — R519 Headache, unspecified: Secondary | ICD-10-CM

## 2019-03-29 DIAGNOSIS — Z8669 Personal history of other diseases of the nervous system and sense organs: Secondary | ICD-10-CM

## 2019-03-29 DIAGNOSIS — F418 Other specified anxiety disorders: Secondary | ICD-10-CM | POA: Diagnosis not present

## 2019-03-29 DIAGNOSIS — G44209 Tension-type headache, unspecified, not intractable: Secondary | ICD-10-CM | POA: Diagnosis not present

## 2019-03-29 MED ORDER — TOPIRAMATE 25 MG PO TABS: 25.0000 mg | ORAL_TABLET | Freq: Two times a day (BID) | ORAL | 2 refills | Status: DC

## 2019-03-29 MED ORDER — FLUOXETINE HCL 20 MG PO TABS: 20.0000 mg | ORAL_TABLET | Freq: Every day | ORAL | 3 refills | Status: DC

## 2019-03-29 MED ORDER — TIZANIDINE HCL 4 MG PO TABS: 4.0000 mg | ORAL_TABLET | Freq: Four times a day (QID) | ORAL | 2 refills | Status: DC | PRN

## 2019-03-29 NOTE — Progress Notes (Signed)
[facility] [address] [place]. [place] [address] [phone]       OFFICE CONSULT NOTE  [name] Date of Birth:  21-Nov-1955 Medical Record Number:  [id number]   Referring MD: Luetta Nutting Reason for Referral: Headache  HPI: [name] is a 63 year old Caucasian lady who is referred today for evaluation for headaches.  History is obtained from the patient, review of referral notes as well as her prior medical records in care everywhere from [facility].  I have also reviewed imaging films results in PACS.  Patient states that she was initially diagnosed with trigeminal neuralgia about 3 years ago with pain in the left side of the face.  She was initially seen at [facility] for about a year and initially did well and medications which she is unable to name seem to help.  However a year or so later the pain returned at this time for insurance reasons she went to [facility] where she saw neurosurgeon Dr. Arlan Organ who did microvascular decompression on 09/06/2018 with excellent improvement in her trigeminal neuralgic pain.  Review of records in care everywhere shows that MRI scan of the brain on 03/31/2018 done at [facility] showed left branch of superior cerebellar artery abutting the trigeminal nerve but without compression.  Patient started having headaches following the surgery and reported these headaches as being involving the back of the head as well as the left side of the vertex they were constant moderate in intensity mostly but in the mornings the headaches were quite severe and intolerable.  She describes his headache as being pressure-like and constant as if somebody sitting her with a baseball when the headache is severe.  She has found that putting ice on the back of her head seems to ease the pain.  She was seen by Dr. Arlan Organ who in fact and did a repeat surgery on 12/20/2018 4-year expiration of the wound but did not find any  scar tissue or anything to explain her headaches.  Her headaches have persisted since then.  She has been taking oxycodone which seems to help but she does not have a prescription for this and gets it on the market.  She has been recently prescribed trazodone by her primary physician which seems to help her sleep.  She also complained of some pain and spasm in her right thigh due to hip bursitis and was also prescribed Zanaflex and she takes only 4 mg at night.  She tried taking it during the day but it made her too sleepy.  She has been taking 1 or 2 tablets of oxycodone on a daily basis.  She denies any accompanying visual symptoms with the headaches, nausea, vomiting, light or sound sensitivity.  She denies any changes in her gait balance focal extremity weakness or numbness.  She has no prior history of migraines.  She had a repeat MRI scan of the brain done on 12/01/2018 at [facility] which showed sequelae of left suboccipital craniotomy and no acute abnormalities.  There is mild changes of chronic small vessel disease.  Patient has not tried medications like Topamax, Depakote, gabapentin for headache prevention.  She is complains of muscle tightness and stiffness in the back of the neck and head but has not been doing regular activities for stress relaxation or regular neck stretching exercises so far.  She has no residual trigeminal neuralgic pain or any sensory loss or hearing loss.  ROS:  14 system review of systems is positive for headache, neck pain, muscle tightness, and depression, anxiety and all other systems negative  PMH:  Past Medical History:  Diagnosis Date   Anxiety    Depression     Social History:  Social History   Socioeconomic History   Marital status: Divorced    Spouse name: Not on file   Number of children: Not on file   Years of education: Not on file   Highest education level: Not on file  Occupational History   Not on file  Social Needs   Financial  resource strain: Not on file   Food insecurity    Worry: Not on file    Inability: Not on file   Transportation needs    Medical: Not on file    Non-medical: Not on file  Tobacco Use   Smoking status: Former Smoker   Smokeless tobacco: Never Used  Substance and Sexual Activity   Alcohol use: Yes    Comment: occasionally    Drug use: No   Sexual activity: Yes    Birth control/protection: None  Lifestyle   Physical activity    Days per week: Not on file    Minutes per session: Not on file   Stress: Not on file  Relationships   Social connections    Talks on phone: Not on file    Gets together: Not on file    Attends religious service: Not on file    Active member of club or organization: Not on file    Attends meetings of clubs or organizations: Not on file    Relationship status: Not on file   Intimate partner violence    Fear of current or ex partner: Not on file    Emotionally abused: Not on file    Physically abused: Not on file    Forced sexual activity: Not on file  Other Topics Concern   Not on file  Social History Narrative   Not on file    Medications:   Current Outpatient Medications on File Prior to Visit  Medication Sig Dispense Refill   meloxicam (MOBIC) 15 MG tablet TAKE 1 TABLET(15 MG) BY MOUTH DAILY 90 tablet 0   Multiple Vitamin (QUINTABS) TABS Take by mouth.     traZODone (DESYREL) 50 MG tablet Take 1-2 tablets (50-100 mg total) by mouth at bedtime as needed for sleep. 60 tablet 3   No current facility-administered medications on file prior to visit.     Allergies:   Allergies  Allergen Reactions   Penicillins Hives    Physical Exam General: well developed, well nourished, seated, in no evident distress Head: head normocephalic and atraumatic.   Neck: supple with no carotid or supraclavicular bruits Cardiovascular: regular rate and rhythm, no murmurs Musculoskeletal: no deformity.  Mild spasm of posterior neck and upper  shoulder muscles with tenderness.  Surgical scar from left suboccipital craniotomy noted on the left Skin:  no rash/petichiae Vascular:  Normal pulses all extremities  Neurologic Exam Mental Status: Awake and fully alert. Oriented to place and time. Recent and remote memory intact. Attention span, concentration and fund of knowledge appropriate. Mood and affect appropriate.  Cranial Nerves: Fundoscopic exam reveals sharp disc margins. Pupils equal, briskly reactive to light. Extraocular movements full without nystagmus. Visual fields full to confrontation. Hearing intact. Facial sensation intact. Face, tongue, palate moves normally and symmetrically.  Motor: Normal bulk and tone. Normal strength in all tested extremity muscles. Sensory.: intact to touch ,  pinprick , position and vibratory sensation.  Coordination: Rapid alternating movements normal in all extremities. Finger-to-nose and heel-to-shin performed accurately bilaterally. Gait and Station: Arises from chair without difficulty. Stance is normal. Gait demonstrates normal stride length and balance . Able to heel, toe and tandem walk without difficulty.  Reflexes: 1+ and symmetric. Toes downgoing.       ASSESSMENT: 38 lady with chronic daily headaches since February 2020 following surgery for trigeminal neuralgia likely a combination of muscle tension headaches with analgesic rebound.  History of refractory trigeminal neuralgia on the left status post microvascular decompression with excellent response     PLAN: I had a long discussion with the patient with regards to her chronic daily headaches which started after her surgery for trigeminal neuralgia and appears likely to be tension headaches with a contribution from analgesic rebound from taking oxycodone daily.  I recommend trial of Topamax start 25 mg daily into 1 week to be increased to twice daily and thereafter as tolerated.  Increase Zanaflex to 2 mg in the morning and 4 mg at  night.  I encouraged her to do regular neck stretching exercises.  She was advised to cut back using oxycodone because of habituation protect potential and analgesic rebound effect.  I also encouraged her to follow-up with her primary physician for treatment for underlying depression as well as to increase participation in regular stress relaxation activities like meditation yoga and exercise.  Greater than 50% time during this 45-minute consultation visit were spent on counseling and coordination of care about her daily headaches and discussion about analgesic rebound and muscle tension headaches and answering questions she will return for follow-up in 2 months or call earlier if necessary.  Note: This document was prepared with digital dictation and possible smart phrase technology. Any transcriptional errors that result from this process are unintentional.

## 2019-03-29 NOTE — Telephone Encounter (Signed)
Amy pharmacist at [facility] calling which set of directions  [phone] ?   tiZANidine (ZANAFLEX) 4 MG tablet 30 tablet 2 03/29/2019    Sig - Route: Take 1 tablet (4 mg total) by mouth every 6 (six) hours as needed for muscle spasms. Take 1/2 tablet in am and 1 tablet at night - Oral

## 2019-03-29 NOTE — Assessment & Plan Note (Signed)
-  She has done well with fluoxetine in the past, will restart this at 20mg  daily.  -Continue trazodone at bedtime.  -Consider counseling/therapy.  -Plan to follow up in 4-6 weeks.

## 2019-03-29 NOTE — Progress Notes (Signed)
Caitlyn Hill - 63 y.o. female MRN [id number]  Date of birth: Sep 29, 1955   This visit type was conducted due to national recommendations for restrictions regarding the COVID-19 Pandemic (e.g. social distancing).  This format is felt to be most appropriate for this patient at this time.  All issues noted in this document were discussed and addressed.  No physical exam was performed (except for noted visual exam findings with Video Visits).  I discussed the limitations of evaluation and management by telemedicine and the availability of in person appointments. The patient expressed understanding and agreed to proceed.  I connected with@ on 03/29/19 at  3:15 PM EDT by a video enabled telemedicine application and verified that I am speaking with the correct person using two identifiers.   Patient Location[place] [address] [place] [id number]   [facility]  Chief Complaint  Patient presents with  . Depression    HPI  Caitlyn Hill is a 63 y.o. female who presents via audio/video conferencing for a telehealth visit today.  She is connecting today to discuss depression and anxiety.  She was seen by neurology today to discuss her ongoing headache and mentioned depressive symptoms.  It was suggested that she follow up with me to discuss this.  She reports depressive symptoms of decreased energy, mild anhedonia and difficulty with sleep.  She was started on trazodone at last appointment with me for insomnia.  She reports that insomnia has improved since starting this.  She reports that she took prozac and clonazepam after the death of her father and these worked well for her.     [facility] 03/29/2019  Decreased Interest 2  Down, Depressed, Hopeless 3  PHQ - 2 Score 5  Altered sleeping 0  Tired, decreased energy 3  Change in appetite 1  Feeling bad or failure about yourself  0  Trouble concentrating 0  Moving slowly or fidgety/restless 0   Suicidal thoughts 0  PHQ-9 Score 9   GAD 7 : Generalized Anxiety Score 03/29/2019  Nervous, Anxious, on Edge 1  Control/stop worrying 1  Worry too much - different things 1  Trouble relaxing 1  Restless 1  Easily annoyed or irritable 2  Afraid - awful might happen 0  Total GAD 7 Score 7       ROS:  A comprehensive ROS was completed and negative except as noted per HPI  Past Medical History:  Diagnosis Date  . Anxiety   . Depression     Past Surgical History:  Procedure Laterality Date  . CEREBRAL MICROVASCULAR DECOMPRESSION    . CESAREAN SECTION    . FOOT SURGERY      Family History  Problem Relation Age of Onset  . Cancer Father        lung    Social History   Socioeconomic History  . Marital status: Divorced    Spouse name: Not on file  . Number of children: Not on file  . Years of education: Not on file  . Highest education level: Not on file  Occupational History  . Not on file  Social Needs  . Financial resource strain: Not on file  . Food insecurity    Worry: Not on file    Inability: Not on file  . Transportation needs    Medical: Not on file    Non-medical: Not on file  Tobacco Use  . Smoking status: Former Games developermoker  . Smokeless tobacco:  Never Used  Substance and Sexual Activity  . Alcohol use: Yes    Comment: occasionally   . Drug use: No  . Sexual activity: Yes    Birth control/protection: None  Lifestyle  . Physical activity    Days per week: Not on file    Minutes per session: Not on file  . Stress: Not on file  Relationships  . Social Herbalist on phone: Not on file    Gets together: Not on file    Attends religious service: Not on file    Active member of club or organization: Not on file    Attends meetings of clubs or organizations: Not on file    Relationship status: Not on file  . Intimate partner violence    Fear of current or ex partner: Not on file    Emotionally abused: Not on file    Physically abused:  Not on file    Forced sexual activity: Not on file  Other Topics Concern  . Not on file  Social History Narrative  . Not on file     Current Outpatient Medications:  .  meloxicam (MOBIC) 15 MG tablet, TAKE 1 TABLET(15 MG) BY MOUTH DAILY, Disp: 90 tablet, Rfl: 0 .  Multiple Vitamin (QUINTABS) TABS, Take by mouth., Disp: , Rfl:  .  tiZANidine (ZANAFLEX) 4 MG tablet, Take 1 tablet (4 mg total) by mouth every 6 (six) hours as needed for muscle spasms. Take 1/2 tablet in am and 1 tablet at night, Disp: 30 tablet, Rfl: 2 .  traZODone (DESYREL) 50 MG tablet, Take 1-2 tablets (50-100 mg total) by mouth at bedtime as needed for sleep., Disp: 60 tablet, Rfl: 3 .  topiramate (TOPAMAX) 25 MG tablet, Take 1 tablet (25 mg total) by mouth 2 (two) times daily. Start 1 tablet in morning x 1 week and then twice daily (Patient not taking: Reported on 03/29/2019), Disp: 30 tablet, Rfl: 2  EXAM:  VITALS per patient if applicable: There were no vitals taken for this visit.  GENERAL: alert, oriented, appears well and in no acute distress  HEENT: atraumatic, conjunttiva clear, no obvious abnormalities on inspection of external nose and ears  NECK: normal movements of the head and neck  LUNGS: on inspection no signs of respiratory distress, breathing rate appears normal, no obvious gross SOB, gasping or wheezing  CV: no obvious cyanosis  MS: moves all visible extremities without noticeable abnormality  PSYCH/NEURO: pleasant and cooperative, no obvious depression or anxiety, speech and thought processing grossly intact  ASSESSMENT AND PLAN:  Discussed the following assessment and plan:  Depression with anxiety -She has done well with fluoxetine in the past, will restart this at 20mg  daily.  -Continue trazodone at bedtime.  -Consider counseling/therapy.  -Plan to follow up in 4-6 weeks.        I discussed the assessment and treatment plan with the patient. The patient was provided an opportunity  to ask questions and all were answered. The patient agreed with the plan and demonstrated an understanding of the instructions.   The patient was advised to call back or seek an in-person evaluation if the symptoms worsen or if the condition fails to improve as anticipated.    Luetta Nutting, DO

## 2019-03-29 NOTE — Patient Instructions (Addendum)
I had a long discussion with the patient with regards to her chronic daily headaches which started after her surgery for trigeminal neuralgia and appears likely to be tension headaches with a contribution from analgesic rebound from taking oxycodone daily.  I recommend trial of Topamax start 25 mg daily into 1 week to be increased to twice daily and thereafter as tolerated.  Increase Zanaflex to 2 mg in the morning and 4 mg at night.  I encouraged her to do regular neck stretching exercises.  She was advised to cut back using oxycodone because of habituation protect potential and analgesic rebound effect.  I also encouraged her to follow-up with her primary physician for treatment for underlying depression as well as to increase participation in regular stress relaxation activities like meditation yoga and exercise.  She will return for follow-up in 2 months or call earlier if necessary.  Tension Headache, Adult A tension headache is a feeling of pain, pressure, or aching in the head that is often felt over the front and sides of the head. The pain can be dull, or it can feel tight (constricting). There are two types of tension headache:  Episodic tension headache. This is when the headaches happen fewer than 15 days a month.  Chronic tension headache. This is when the headaches happen more than 15 days a month during a 35-month period. A tension headache can last from 30 minutes to several days. It is the most common kind of headache. Tension headaches are not normally associated with nausea or vomiting, and they do not get worse with physical activity. What are the causes? The exact cause of this condition is not known. Tension headaches are often triggered by stress, anxiety, or depression. Other triggers include:  Alcohol.  Too much caffeine or caffeine withdrawal.  Respiratory infections, such as colds, flu, or sinus infections.  Dental problems or teeth clenching.  Tiredness (fatigue).   Holding your head and neck in the same position for a long period of time, such as while using a computer.  Smoking.  Arthritis of the neck. What are the signs or symptoms? Symptoms of this condition include:  A feeling of pressure or tightness around the head.  Dull, aching head pain.  Pain over the front and sides of the head.  Tenderness in the muscles of the head, neck, and shoulders. How is this diagnosed? This condition may be diagnosed based on your symptoms, your medical history, and a physical exam. If your symptoms are severe or unusual, you may have imaging tests, such as a CT scan or an MRI of your head. Your vision may also be checked. How is this treated? This condition may be treated with lifestyle changes and with medicines that help relieve symptoms. Follow these instructions at home: Managing pain  Take over-the-counter and prescription medicines only as told by your health care provider.  When you have a headache, lie down in a dark, quiet room.  If directed, apply ice to the head and neck: ? Put ice in a plastic bag. ? Place a towel between your skin and the bag. ? Leave the ice on for 20 minutes, 2-3 times a day.  If directed, apply heat to the back of your neck as often as told by your health care provider. Use the heat source that your health care provider recommends, such as a moist heat pack or a heating pad. ? Place a towel between your skin and the heat source. ? Leave the heat  on for 20-30 minutes. ? Remove the heat if your skin turns bright red. This is especially important if you are unable to feel pain, heat, or cold. You may have a greater risk of getting burned. Eating and drinking  Eat meals on a regular schedule.  Limit alcohol intake to no more than 1 drink a day for nonpregnant women and 2 drinks a day for men. One drink equals 12 oz of beer, 5 oz of wine, or 1 oz of hard liquor.  Drink enough fluid to keep your urine pale yellow.   Decrease your caffeine intake, or stop using caffeine. Lifestyle  Get 7-9 hours of sleep each night, or get the amount of sleep recommended by your health care provider.  At bedtime, remove all electronic devices from your room. Electronic devices include computers, phones, and tablets.  Find ways to manage your stress. Some things that can help relieve stress include: ? Exercise. ? Deep breathing exercises. ? Yoga. ? Listening to music. ? Positive mental imagery.  Try to sit up straight and avoid tensing your muscles.  Do not use any products that contain nicotine or tobacco, such as cigarettes and e-cigarettes. If you need help quitting, ask your health care provider. General instructions   Keep all follow-up visits as told by your health care provider. This is important.  Avoid any headache triggers. Keep a headache journal to help find out what may trigger your headaches. For example, write down: ? What you eat and drink. ? How much sleep you get. ? Any change to your diet or medicines. Contact a health care provider if:  Your headache does not get better.  Your headache comes back.  You are sensitive to sounds, light, or smells because of a headache.  You have nausea or you vomit.  Your stomach hurts. Get help right away if:  You suddenly develop a very severe headache along with any of the following: ? A stiff neck. ? Nausea and vomiting. ? Confusion. ? Weakness. ? Double vision or loss of vision. ? Shortness of breath. ? Rash. ? Unusual sleepiness. ? Fever. ? Trouble speaking. ? Pain in your eyes or ears. ? Trouble walking or balancing. ? Feeling faint or passing out. Summary  A tension headache is a feeling of pain, pressure, or aching in the head that is often felt over the front and sides of the head.  A tension headache can last from 30 minutes to several days. It is the most common kind of headache.  This condition may be diagnosed based on your  symptoms, your medical history, and a physical exam.  This condition may be treated with lifestyle changes and with medicines that help relieve symptoms. This information is not intended to replace advice given to you by your health care provider. Make sure you discuss any questions you have with your health care provider. Document Released: 07/21/2005 Document Revised: 07/03/2017 Document Reviewed: 10/31/2016 Elsevier Patient Education  2020 [employer]  Neck Exercises Ask your health care provider which exercises are safe for you. Do exercises exactly as told by your health care provider and adjust them as directed. It is normal to feel mild stretching, pulling, tightness, or discomfort as you do these exercises. Stop right away if you feel sudden pain or your pain gets worse. Do not begin these exercises until told by your health care provider. Neck exercises can be important for many reasons. They can improve strength and maintain flexibility in your neck, which  will help your upper back and prevent neck pain. Stretching exercises Rotation neck stretching  1. Sit in a chair or stand up. 2. Place your feet flat on the floor, shoulder width apart. 3. Slowly turn your head (rotate) to the right until a slight stretch is felt. Turn it all the way to the right so you can look over your right shoulder. Do not tilt or tip your head. 4. Hold this position for 10-30 seconds. 5. Slowly turn your head (rotate) to the left until a slight stretch is felt. Turn it all the way to the left so you can look over your left shoulder. Do not tilt or tip your head. 6. Hold this position for 10-30 seconds. Repeat __________ times. Complete this exercise __________ times a day. Neck retraction 1. Sit in a sturdy chair or stand up. 2. Look straight ahead. Do not bend your neck. 3. Use your fingers to push your chin backward (retraction). Do not bend your neck for this movement. Continue to face straight ahead.  If you are doing the exercise properly, you will feel a slight sensation in your throat and a stretch at the back of your neck. 4. Hold the stretch for 1-2 seconds. Repeat __________ times. Complete this exercise __________ times a day. Strengthening exercises Neck press 1. Lie on your back on a firm bed or on the floor with a pillow under your head. 2. Use your neck muscles to push your head down on the pillow and straighten your spine. 3. Hold the position as well as you can. Keep your head facing up (in a neutral position) and your chin tucked. 4. Slowly count to 5 while holding this position. Repeat __________ times. Complete this exercise __________ times a day. Isometrics These are exercises in which you strengthen the muscles in your neck while keeping your neck still (isometrics). 1. Sit in a supportive chair and place your hand on your forehead. 2. Keep your head and face facing straight ahead. Do not flex or extend your neck while doing isometrics. 3. Push forward with your head and neck while pushing back with your hand. Hold for 10 seconds. 4. Do the sequence again, this time putting your hand against the back of your head. Use your head and neck to push backward against the hand pressure. 5. Finally, do the same exercise on either side of your head, pushing sideways against the pressure of your hand. Repeat __________ times. Complete this exercise __________ times a day. Prone head lifts 1. Lie face-down (prone position), resting on your elbows so that your chest and upper back are raised. 2. Start with your head facing downward, near your chest. Position your chin either on or near your chest. 3. Slowly lift your head upward. Lift until you are looking straight ahead. Then continue lifting your head as far back as you can comfortably stretch. 4. Hold your head up for 5 seconds. Then slowly lower it to your starting position. Repeat __________ times. Complete this exercise  __________ times a day. Supine head lifts 1. Lie on your back (supine position), bending your knees to point to the ceiling and keeping your feet flat on the floor. 2. Lift your head slowly off the floor, raising your chin toward your chest. 3. Hold for 5 seconds. Repeat __________ times. Complete this exercise __________ times a day. Scapular retraction 1. Stand with your arms at your sides. Look straight ahead. 2. Slowly pull both shoulders (scapulae) backward and downward (retraction) until  you feel a stretch between your shoulder blades in your upper back. 3. Hold for 10-30 seconds. 4. Relax and repeat. Repeat __________ times. Complete this exercise __________ times a day. Contact a health care provider if:  Your neck pain or discomfort gets much worse when you do an exercise.  Your neck pain or discomfort does not improve within 2 hours after you exercise. If you have any of these problems, stop exercising right away. Do not do the exercises again unless your health care provider says that you can. Get help right away if:  You develop sudden, severe neck pain. If this happens, stop exercising right away. Do not do the exercises again unless your health care provider says that you can. This information is not intended to replace advice given to you by your health care provider. Make sure you discuss any questions you have with your health care provider. Document Released: 07/02/2015 Document Revised: 05/19/2018 Document Reviewed: 05/19/2018 Elsevier Patient Education  2020 [employer].

## 2019-03-30 ENCOUNTER — Other Ambulatory Visit: Payer: Self-pay | Admitting: Family Medicine

## 2019-03-30 ENCOUNTER — Telehealth: Payer: Self-pay | Admitting: Family Medicine

## 2019-03-30 NOTE — Telephone Encounter (Signed)
I spoke with Amy pharmacist at [facility].I stated per Dr .Leonie Man note its  Zanaflex to 2 mg in the morning and 4 mg at night. Amy will adjust the directions.

## 2019-03-30 NOTE — Telephone Encounter (Signed)
Amy @ [facility] has called RN Katrina back re: pt's tiZANidine (ZANAFLEX) 4 MG .  Please call Amy back at [phone]

## 2019-03-30 NOTE — Telephone Encounter (Signed)
Requested medication (s) are due for refill today: yes  Requested medication (s) are on the active medication list: yes  Last refill:  03/28/2019  Future visit scheduled: yes  Notes to clinic:  Pt called stating the pharmacy stated they did not have Prozac in the store and that they would be getting in contact with office to change it. Please advise   Requested Prescriptions  Pending Prescriptions Disp Refills   FLUoxetine (PROZAC) 20 MG tablet 30 tablet 3    Sig: Take 1 tablet (20 mg total) by mouth daily.     Psychiatry:  Antidepressants - SSRI Passed - 03/30/2019  9:41 AM      Passed - Valid encounter within last 6 months    Recent Outpatient Visits          2 weeks ago Acute intractable tension-type headache   [facility], [place], DO   5 years ago Acute cystitis   [facility], Janalee Dane, MD   6 years ago Insomnia   [facility] at [facility], Linton Ham, MD             Passed - Completed PHQ-2 or PHQ-9 in the last 360 days.

## 2019-03-30 NOTE — Telephone Encounter (Signed)
Pt called stating the pharmacy stated they did not have Prozac in the store and that they would be getting in contact with office to change it. Please advise.   [id number] - [place], [place] [address] [place] [address]  Phone: [phone] Fax: [phone]  Not a 24 hour pharmacy; exact hours not known.

## 2019-03-30 NOTE — Telephone Encounter (Signed)
I tried to call patient to schedule 4-6 weeks f/u for depression per Dr. Zigmund Daniel. Patient voicemail is full and unable to leave a message. I will send patient a mychart message to call office and schedule.

## 2019-04-14 ENCOUNTER — Other Ambulatory Visit (HOSPITAL_COMMUNITY): Payer: Self-pay | Admitting: Neurology

## 2019-04-14 ENCOUNTER — Telehealth: Payer: Self-pay | Admitting: Neurology

## 2019-04-14 MED ORDER — TOPIRAMATE 50 MG PO TABS: 50.0000 mg | ORAL_TABLET | Freq: Two times a day (BID) | ORAL | 2 refills | Status: DC

## 2019-04-14 NOTE — Telephone Encounter (Signed)
Pt called stating that the topiramate (TOPAMAX) 25 MG tablet that was prescribed to her is not working for her and she would like too know if something else can be given to her. Please advise.

## 2019-04-14 NOTE — Telephone Encounter (Signed)
I returned call from the patient who told me that her headaches not significantly improved on Topamax 25 twice daily which she appeared to be tolerating well without side effects.  I recommend we increase the dose of Topamax to 50 mg twice daily and she was advised to call me in a week.  She voiced understanding.  I changed her prescription to reflect this.

## 2019-04-16 ENCOUNTER — Other Ambulatory Visit: Payer: Self-pay | Admitting: Neurology

## 2019-04-21 ENCOUNTER — Other Ambulatory Visit: Payer: Self-pay | Admitting: Family Medicine

## 2019-04-26 ENCOUNTER — Encounter: Payer: Self-pay | Admitting: Family Medicine

## 2019-04-26 ENCOUNTER — Telehealth (INDEPENDENT_AMBULATORY_CARE_PROVIDER_SITE_OTHER): Payer: Medicare HMO | Admitting: Family Medicine

## 2019-04-26 ENCOUNTER — Other Ambulatory Visit: Payer: Self-pay

## 2019-04-26 DIAGNOSIS — R69 Illness, unspecified: Secondary | ICD-10-CM | POA: Diagnosis not present

## 2019-04-26 DIAGNOSIS — F418 Other specified anxiety disorders: Secondary | ICD-10-CM | POA: Diagnosis not present

## 2019-04-26 NOTE — Assessment & Plan Note (Signed)
-  PHQ and GAD7 scores have increased since last visit however she reports that she has had improvement since starting medication -Overall she is tolerating well and we discussed trying increased dose which she agrees with.  -We'll increase fluoxetine to 40mg  daily and plan for f/u in 4-6 weeks.

## 2019-04-26 NOTE — Progress Notes (Signed)
Caitlyn Hill - 63 y.o. female MRN [id number]  Date of birth: [birth year]   This visit type was conducted due to national recommendations for restrictions regarding the COVID-19 Pandemic (e.g. social distancing).  This format is felt to be most appropriate for this patient at this time.  All issues noted in this document were discussed and addressed.  No physical exam was performed (except for noted visual exam findings with Video Visits).  I discussed the limitations of evaluation and management by telemedicine and the availability of in person appointments. The patient expressed understanding and agreed to proceed.  I connected with@ on 04/26/19 at 11:00 AM EDT by a video enabled telemedicine application and verified that I am speaking with the correct person using two identifiers.   Patient Location: Home [address] [place] [address]   Provider location:   [facility]  Chief Complaint  Patient presents with  . Follow-up    for depression/ no complaints    HPI  Caitlyn Hill is a 63 y.o. female who presents via audio/video conferencing for a telehealth visit today.  She is following up today for depression and anxiety.  She reports that she is feeling better since starting prozac and is feeling less anxious and is less tearful.  She is having some GI symptoms of loose stools that started this week, unsure if this is medication related or not.  She denies nausea.  Her headaches are improving some.     Depression screen [facility] 2/9 04/26/2019 03/29/2019  Decreased Interest 3 2  Down, Depressed, Hopeless 2 3  PHQ - 2 Score 5 5  Altered sleeping 2 0  Tired, decreased energy 3 3  Change in appetite 3 1  Feeling bad or failure about yourself  1 0  Trouble concentrating 1 0  Moving slowly or fidgety/restless 1 0  Suicidal thoughts 1 0  PHQ-9 Score 17 9   GAD 7 : Generalized Anxiety Score 04/26/2019 03/29/2019  Nervous, Anxious, on Edge 1 1  Control/stop worrying 2 1  Worry  too much - different things 2 1  Trouble relaxing 2 1  Restless 0 1  Easily annoyed or irritable 0 2  Afraid - awful might happen 1 0  Total GAD 7 Score 8 7      ROS:  A comprehensive ROS was completed and negative except as noted per HPI  Past Medical History:  Diagnosis Date  . Anxiety   . Depression     Past Surgical History:  Procedure Laterality Date  . CEREBRAL MICROVASCULAR DECOMPRESSION    . CESAREAN SECTION    . FOOT SURGERY      Family History  Problem Relation Age of Onset  . Cancer Father        lung    Social History   Socioeconomic History  . Marital status: Divorced    Spouse name: Not on file  . Number of children: Not on file  . Years of education: Not on file  . Highest education level: Not on file  Occupational History  . Not on file  Social Needs  . Financial resource strain: Not on file  . Food insecurity    Worry: Not on file    Inability: Not on file  . Transportation needs    Medical: Not on file    Non-medical: Not on file  Tobacco Use  . Smoking status: Former Research scientist (life sciences)  . Smokeless tobacco: Never Used  Substance and Sexual Activity  .  Alcohol use: Yes    Comment: occasionally   . Drug use: No  . Sexual activity: Yes    Birth control/protection: None  Lifestyle  . Physical activity    Days per week: Not on file    Minutes per session: Not on file  . Stress: Not on file  Relationships  . Social Musician on phone: Not on file    Gets together: Not on file    Attends religious service: Not on file    Active member of club or organization: Not on file    Attends meetings of clubs or organizations: Not on file    Relationship status: Not on file  . Intimate partner violence    Fear of current or ex partner: Not on file    Emotionally abused: Not on file    Physically abused: Not on file    Forced sexual activity: Not on file  Other Topics Concern  . Not on file  Social History Narrative  . Not on file      Current Outpatient Medications:  .  FLUoxetine (PROZAC) 20 MG tablet, TAKE 1 TABLET BY MOUTH EVERY DAY, Disp: 90 tablet, Rfl: 2 .  meloxicam (MOBIC) 15 MG tablet, TAKE 1 TABLET(15 MG) BY MOUTH DAILY, Disp: 90 tablet, Rfl: 0 .  Multiple Vitamin (QUINTABS) TABS, Take by mouth., Disp: , Rfl:  .  tiZANidine (ZANAFLEX) 4 MG tablet, Take 1 tablet (4 mg total) by mouth every 6 (six) hours as needed for muscle spasms. Take 1/2 tablet in am and 1 tablet at night, Disp: 30 tablet, Rfl: 2 .  topiramate (TOPAMAX) 50 MG tablet, Take 1 tablet (50 mg total) by mouth 2 (two) times daily., Disp: 60 tablet, Rfl: 2 .  traZODone (DESYREL) 50 MG tablet, Take 1-2 tablets (50-100 mg total) by mouth at bedtime as needed for sleep., Disp: 60 tablet, Rfl: 3  EXAM:  VITALS per patient if applicable: There were no vitals taken for this visit.  GENERAL: alert, oriented, appears well and in no acute distress  HEENT: atraumatic, conjunttiva clear, no obvious abnormalities on inspection of external nose and ears  NECK: normal movements of the head and neck  LUNGS: on inspection no signs of respiratory distress, breathing rate appears normal, no obvious gross SOB, gasping or wheezing  CV: no obvious cyanosis  MS: moves all visible extremities without noticeable abnormality  PSYCH/NEURO: pleasant and cooperative, no obvious depression or anxiety, speech and thought processing grossly intact  ASSESSMENT AND PLAN:  Discussed the following assessment and plan:  Depression with anxiety -PHQ and GAD7 scores have increased since last visit however she reports that she has had improvement since starting medication -Overall she is tolerating well and we discussed trying increased dose which she agrees with.  -We'll increase fluoxetine to 40mg  daily and plan for f/u in 4-6 weeks.         I discussed the assessment and treatment plan with the patient. The patient was provided an opportunity to ask questions and all were  answered. The patient agreed with the plan and demonstrated an understanding of the instructions.   The patient was advised to call back or seek an in-person evaluation if the symptoms worsen or if the condition fails to improve as anticipated.     , DO

## 2019-05-31 DIAGNOSIS — R69 Illness, unspecified: Secondary | ICD-10-CM | POA: Diagnosis not present

## 2019-06-03 ENCOUNTER — Telehealth: Payer: Self-pay

## 2019-06-03 NOTE — Telephone Encounter (Signed)

## 2019-06-06 ENCOUNTER — Other Ambulatory Visit: Payer: Self-pay

## 2019-06-06 ENCOUNTER — Ambulatory Visit (INDEPENDENT_AMBULATORY_CARE_PROVIDER_SITE_OTHER): Payer: Medicare HMO | Admitting: Family Medicine

## 2019-06-06 ENCOUNTER — Encounter: Payer: Self-pay | Admitting: Family Medicine

## 2019-06-06 VITALS — BP 123/80 | HR 76 | Temp 96.7°F | Ht 65.0 in | Wt 145.0 lb

## 2019-06-06 DIAGNOSIS — M7061 Trochanteric bursitis, right hip: Secondary | ICD-10-CM

## 2019-06-06 DIAGNOSIS — M25511 Pain in right shoulder: Secondary | ICD-10-CM | POA: Diagnosis not present

## 2019-06-06 DIAGNOSIS — R69 Illness, unspecified: Secondary | ICD-10-CM | POA: Diagnosis not present

## 2019-06-06 DIAGNOSIS — G44221 Chronic tension-type headache, intractable: Secondary | ICD-10-CM

## 2019-06-06 DIAGNOSIS — G44229 Chronic tension-type headache, not intractable: Secondary | ICD-10-CM | POA: Insufficient documentation

## 2019-06-06 DIAGNOSIS — F418 Other specified anxiety disorders: Secondary | ICD-10-CM

## 2019-06-06 DIAGNOSIS — M7581 Other shoulder lesions, right shoulder: Secondary | ICD-10-CM | POA: Insufficient documentation

## 2019-06-06 NOTE — Assessment & Plan Note (Signed)
-  Improved with fluoxetine, continue at current dose.  Continue trazodone for insomnia.

## 2019-06-06 NOTE — Patient Instructions (Signed)
I have entered a referral to sports medicine Keep your f/u with neurology We'll plan for you to follow up with [place] in about 3 months.

## 2019-06-06 NOTE — Assessment & Plan Note (Signed)
Referral to sports medicine for injection of bursa.

## 2019-06-06 NOTE — Assessment & Plan Note (Signed)
Referred to sports medicine.

## 2019-06-06 NOTE — Assessment & Plan Note (Signed)
-  Currently being followed by neurology, hasn't really noticed a whole lot of improvement with topiramate.  Discussed recommendations to stop opioid analgesics as a large component to this is likely rebound.

## 2019-06-06 NOTE — Progress Notes (Signed)
Caitlyn Hill - 63 y.o. female MRN [id number]  Date of birth: 11-16-1955  Subjective Chief Complaint  Patient presents with  . Follow-up    discuss meds.    HPI Caitlyn Hill is a 63 y.o. female here today for follow up of depression/anxiety/isomnia.  She had fluoxetine increased at previous appt and reports she is doing well at current dose.  She is taking trazodone at bedtime and this works pretty well for her.  She is having pain in R hip and R shoulder.  Pain in R shoulder is chronic, denies injury, numbnes/tingling or weakness.  R hip pain is lateral.  Worse when trying to sleep on that side.  She has seen neuro for migraines and is taking prescribed medications but reports that she continues to get headaches so has continued to take unprescribed opioids (oxycodone).  She has f/u with neuro next month.    ROS:  A comprehensive ROS was completed and negative except as noted per HPI  Allergies  Allergen Reactions  . Penicillins Hives    Past Medical History:  Diagnosis Date  . Anxiety   . Depression     Past Surgical History:  Procedure Laterality Date  . CEREBRAL MICROVASCULAR DECOMPRESSION    . CESAREAN SECTION    . FOOT SURGERY      Social History   Socioeconomic History  . Marital status: Divorced    Spouse name: Not on file  . Number of children: Not on file  . Years of education: Not on file  . Highest education level: Not on file  Occupational History  . Not on file  Social Needs  . Financial resource strain: Not on file  . Food insecurity    Worry: Not on file    Inability: Not on file  . Transportation needs    Medical: Not on file    Non-medical: Not on file  Tobacco Use  . Smoking status: Former Games developer  . Smokeless tobacco: Never Used  Substance and Sexual Activity  . Alcohol use: Yes    Comment: occasionally   . Drug use: No  . Sexual activity: Yes    Birth control/protection: None  Lifestyle  . Physical activity    Days per week: Not on file     Minutes per session: Not on file  . Stress: Not on file  Relationships  . Social Musician on phone: Not on file    Gets together: Not on file    Attends religious service: Not on file    Active member of club or organization: Not on file    Attends meetings of clubs or organizations: Not on file    Relationship status: Not on file  Other Topics Concern  . Not on file  Social History Narrative  . Not on file    Family History  Problem Relation Age of Onset  . Cancer Father        lung    Health Maintenance  Topic Date Due  . HIV Screening  08/21/1968  . TETANUS/TDAP  08/21/1972  . PAP SMEAR-Modifier  08/21/1974  . MAMMOGRAM  08/22/2003  . DEXA SCAN  08/21/2018  . PNA vac Low Risk Adult (1 of 2 - PCV13) 08/21/2018  . COLONOSCOPY  06/06/2019  . INFLUENZA VACCINE  11/02/2019 (Originally 03/05/2019)  . Hepatitis C Screening  Completed    ----------------------------------------------------------------------------------------------------------------------------------------------------------------------------------------------------------------- Physical Exam BP 123/80   Pulse 76   Temp (!) 96.7 F (35.9 C) (  Temporal)   Ht 5\' 5"  (1.651 m)   Wt 145 lb (65.8 kg)   SpO2 97%   BMI 24.13 kg/m   Physical Exam Constitutional:      Appearance: Normal appearance.  HENT:     Head: Normocephalic and atraumatic.  Cardiovascular:     Rate and Rhythm: Normal rate and regular rhythm.  Pulmonary:     Effort: Pulmonary effort is normal.     Breath sounds: Normal breath sounds.  Musculoskeletal:     Comments: TTP over greater trochanter R side.  FROM without weakness of RLE  Shoulder without laxity.  ROM is normal, mild pain with abduction.  No impingement signs.  Rotator cuff muscles with normal strength  Neurological:     General: No focal deficit present.     Mental Status: She is alert.  Psychiatric:        Mood and Affect: Mood normal.        Behavior:  Behavior normal.     ------------------------------------------------------------------------------------------------------------------------------------------------------------------------------------------------------------------- Assessment and Plan  Chronic tension headache -Currently being followed by neurology, hasn't really noticed a whole lot of improvement with topiramate.  Discussed recommendations to stop opioid analgesics as a large component to this is likely rebound.    Trochanteric bursitis, right hip Referral to sports medicine for injection of bursa.    Depression with anxiety -Improved with fluoxetine, continue at current dose.  Continue trazodone for insomnia.   Right shoulder pain -Referred to sports medicine.

## 2019-06-08 ENCOUNTER — Other Ambulatory Visit: Payer: Self-pay | Admitting: Family Medicine

## 2019-06-08 MED ORDER — TRAZODONE HCL 50 MG PO TABS: 50.0000 mg | ORAL_TABLET | Freq: Every evening | ORAL | 1 refills | Status: DC | PRN

## 2019-06-08 MED ORDER — FLUOXETINE HCL 20 MG PO TABS: 40.0000 mg | ORAL_TABLET | Freq: Every day | ORAL | 1 refills | Status: DC

## 2019-06-13 ENCOUNTER — Other Ambulatory Visit: Payer: Self-pay

## 2019-06-13 ENCOUNTER — Ambulatory Visit: Payer: Medicare HMO | Admitting: Family Medicine

## 2019-06-13 ENCOUNTER — Ambulatory Visit: Payer: Self-pay

## 2019-06-13 ENCOUNTER — Encounter: Payer: Self-pay | Admitting: Family Medicine

## 2019-06-13 VITALS — BP 143/82 | HR 69 | Ht 66.0 in | Wt 133.0 lb

## 2019-06-13 DIAGNOSIS — M7061 Trochanteric bursitis, right hip: Secondary | ICD-10-CM

## 2019-06-13 DIAGNOSIS — M7581 Other shoulder lesions, right shoulder: Secondary | ICD-10-CM

## 2019-06-13 MED ORDER — METHYLPREDNISOLONE ACETATE 40 MG/ML IJ SUSP
40.0000 mg | Freq: Once | INTRAMUSCULAR | Status: AC
  Administered 2019-06-13: 40 mg via INTRA_ARTICULAR

## 2019-06-13 NOTE — Assessment & Plan Note (Signed)
Pain seems most related to greater trochanteric bursa.  She does have limited rotation does not have pain that would indicate more of a joint problem. -Greater trochanteric bursa injection. -Counseled on home exercise therapy and supportive care. -Could consider imaging or physical therapy.

## 2019-06-13 NOTE — Patient Instructions (Signed)
Nice to meet you Please try ice  Please try the exercises   Please send me a message in MyChart with any questions or updates.  Please see me back in 4 weeks.   --Dr. Jodey Burbano  

## 2019-06-13 NOTE — Progress Notes (Signed)
Caitlyn Hill - 63 y.o. female MRN [id number]  Date of birth: 07/20/1956  SUBJECTIVE:  Including CC & ROS.  Chief Complaint  Patient presents with  . Hip Pain    right hip  . Shoulder Pain    right shoulder    Caitlyn Hill is a 63 y.o. female that is presenting with right hip pain and right shoulder pain.  The pain of the right hip has been ongoing for a couple of months.  The pain is localized to the greater trochanter area.  She denies any specific inciting event.  The pain is worse when she lies on the affected side.  No prior history of surgery.  Has been taking meloxicam and that seems to improve her symptoms.  Denies any radiculopathy.  The right shoulder is mild and intermittent.  She only notices it with certain movements and a few times during the day.  The meloxicam has helped with this pain as well.  She denies any history of surgery.  Symptoms are localized to the shoulder.   Review of Systems  Constitutional: Negative for fever.  HENT: Negative for congestion.   Respiratory: Negative for cough.   Cardiovascular: Negative for chest pain.  Gastrointestinal: Negative for abdominal pain.  Musculoskeletal: Positive for arthralgias.  Skin: Negative for color change.  Neurological: Negative for weakness.  Hematological: Negative for adenopathy.    HISTORY: Past Medical, Surgical, Social, and Family History Reviewed & Updated per EMR.   Pertinent Historical Findings include:  Past Medical History:  Diagnosis Date  . Anxiety   . Depression     Past Surgical History:  Procedure Laterality Date  . CEREBRAL MICROVASCULAR DECOMPRESSION    . CESAREAN SECTION    . FOOT SURGERY      Allergies  Allergen Reactions  . Penicillins Hives    Family History  Problem Relation Age of Onset  . Cancer Father        lung     Social History   Socioeconomic History  . Marital status: Divorced    Spouse name: Not on file  . Number of children: Not on file  . Years of  education: Not on file  . Highest education level: Not on file  Occupational History  . Not on file  Social Needs  . Financial resource strain: Not on file  . Food insecurity    Worry: Not on file    Inability: Not on file  . Transportation needs    Medical: Not on file    Non-medical: Not on file  Tobacco Use  . Smoking status: Former Games developer  . Smokeless tobacco: Never Used  Substance and Sexual Activity  . Alcohol use: Yes    Comment: occasionally   . Drug use: No  . Sexual activity: Yes    Birth control/protection: None  Lifestyle  . Physical activity    Days per week: Not on file    Minutes per session: Not on file  . Stress: Not on file  Relationships  . Social Musician on phone: Not on file    Gets together: Not on file    Attends religious service: Not on file    Active member of club or organization: Not on file    Attends meetings of clubs or organizations: Not on file    Relationship status: Not on file  . Intimate partner violence    Fear of current or ex partner: Not on file  Emotionally abused: Not on file    Physically abused: Not on file    Forced sexual activity: Not on file  Other Topics Concern  . Not on file  Social History Narrative  . Not on file     PHYSICAL EXAM:  VS: BP (!) 143/82   Pulse 69   Ht 5\' 6"  (1.676 m)   Wt 133 lb (60.3 kg)   BMI 21.47 kg/m  Physical Exam Gen: NAD, alert, cooperative with exam, well-appearing ENT: normal lips, normal nasal mucosa,  Eye: normal EOM, normal conjunctiva and lids CV:  no edema, +2 pedal pulses   Resp: no accessory muscle use, non-labored,   Skin: no rashes, no areas of induration  Neuro: normal tone, normal sensation to touch Psych:  normal insight, alert and oriented MSK:  Right shoulder: Normal active flexion and abduction. Normal internal and external rotation. Normal strength to resistance. Mild pain with empty can testing. Normal speeds test. Normal O'Brien's test.  Right hip: Tenderness palpation of the greater trochanter. Limited internal rotation when compared to the contralateral side. Normal strength resistance with hip flexion, knee flexion and extension. Negative straight leg raise. Neurovascularly intact   Aspiration/Injection Procedure Note Caitlyn Hill 1955-09-28  Procedure: Injection Indications: Right hip pain  Procedure Details Consent: Risks of procedure as well as the alternatives and risks of each were explained to the (patient/caregiver).  Consent for procedure obtained. Time Out: Verified patient identification, verified procedure, site/side was marked, verified correct patient position, special equipment/implants available, medications/allergies/relevent history reviewed, required imaging and test results available.  Performed.  The area was cleaned with iodine and alcohol swabs.    The right greater trochanteric bursa was injected using 1 cc's of 40 mg Depo-Medrol and 4 cc's of 0.25% bupivacaine with a 22 1 1/2" needle.  Ultrasound was used. Images were obtained in short views showing the injection.     A sterile dressing was applied.  Patient did tolerate procedure well.     ASSESSMENT & PLAN:   Trochanteric bursitis, right hip Pain seems most related to greater trochanteric bursa.  She does have limited rotation does not have pain that would indicate more of a joint problem. -Greater trochanteric bursa injection. -Counseled on home exercise therapy and supportive care. -Could consider imaging or physical therapy.  Rotator cuff tendinitis, right Pain is mild in severity.  Has good range of motion. -Counseled on home exercise therapy and supportive care. -Could consider injection physical therapy if no improvement.

## 2019-06-13 NOTE — Assessment & Plan Note (Signed)
Pain is mild in severity.  Has good range of motion. -Counseled on home exercise therapy and supportive care. -Could consider injection physical therapy if no improvement.

## 2019-06-23 DIAGNOSIS — R69 Illness, unspecified: Secondary | ICD-10-CM | POA: Diagnosis not present

## 2019-06-26 ENCOUNTER — Encounter: Payer: Self-pay | Admitting: Internal Medicine

## 2019-06-28 ENCOUNTER — Other Ambulatory Visit: Payer: Self-pay

## 2019-06-28 ENCOUNTER — Telehealth: Payer: Self-pay | Admitting: Neurology

## 2019-06-28 DIAGNOSIS — M62838 Other muscle spasm: Secondary | ICD-10-CM

## 2019-06-28 MED ORDER — TOPIRAMATE 50 MG PO TABS: 50.0000 mg | ORAL_TABLET | Freq: Two times a day (BID) | ORAL | 0 refills | Status: DC

## 2019-06-28 MED ORDER — TIZANIDINE HCL 4 MG PO TABS: 4.0000 mg | ORAL_TABLET | Freq: Four times a day (QID) | ORAL | 2 refills | Status: DC | PRN

## 2019-06-28 NOTE — Telephone Encounter (Signed)
Pt is asking for a refill on her  topiramate (TOPAMAX) 50 MG tablet  [facility]

## 2019-06-28 NOTE — Telephone Encounter (Signed)
Refill sent to [facility] rx delivery per pts request for 90 day until next appt.

## 2019-06-29 ENCOUNTER — Other Ambulatory Visit: Payer: Self-pay | Admitting: Family Medicine

## 2019-06-29 DIAGNOSIS — M62838 Other muscle spasm: Secondary | ICD-10-CM

## 2019-06-29 MED ORDER — TIZANIDINE HCL 4 MG PO TABS: ORAL_TABLET | ORAL | 2 refills | Status: DC

## 2019-07-04 ENCOUNTER — Telehealth: Payer: Self-pay | Admitting: Family Medicine

## 2019-07-04 NOTE — Telephone Encounter (Signed)
This was re-sent on 06/29/2019

## 2019-07-04 NOTE — Telephone Encounter (Signed)
Called pharmacy back and left msg regarding rx.

## 2019-07-04 NOTE — Telephone Encounter (Signed)
[facility] has merged and they need clarification on tizanidine 4 mg and reference number [id number]. Pharm received on 06-28-2019 and med will be on hold until office reply

## 2019-07-05 NOTE — Telephone Encounter (Signed)
Pharmacy called back and they stated they just wanted to know the correct directions for the pt's zanaflex. This was provided. Nothing further is needed.

## 2019-07-12 ENCOUNTER — Other Ambulatory Visit: Payer: Self-pay

## 2019-07-12 ENCOUNTER — Ambulatory Visit: Payer: Medicare HMO | Admitting: Family Medicine

## 2019-07-12 ENCOUNTER — Encounter: Payer: Self-pay | Admitting: Family Medicine

## 2019-07-12 DIAGNOSIS — M7581 Other shoulder lesions, right shoulder: Secondary | ICD-10-CM

## 2019-07-12 DIAGNOSIS — M7061 Trochanteric bursitis, right hip: Secondary | ICD-10-CM | POA: Diagnosis not present

## 2019-07-12 NOTE — Assessment & Plan Note (Signed)
Improvement with exercises as well as over-the-counter Voltaren. -Counseled on home exercise therapy and supportive care. -Could consider physical therapy or injection. -Follow-up as needed.

## 2019-07-12 NOTE — Assessment & Plan Note (Signed)
Improved since the injection.  She does have limitations with her range of motion which could be contributing. -Counseled on home exercise therapy and supportive care. -May need to consider imaging.

## 2019-07-12 NOTE — Progress Notes (Signed)
ANNIEBELLE DEVORE - 63 y.o. female MRN [id number]  Date of birth: January 30, 1956  SUBJECTIVE:  Including CC & ROS.  Chief Complaint  Patient presents with  . Follow-up    follow up for right hip/right shoulder    DAUNA ZISKA is a 63 y.o. female that is following up for her right shoulder pain and right hip pain.  The injection of the hip has improved her symptoms significantly.  Does have pain when she lies on the affected side.  Has been trying Voltaren over-the-counter which improves her symptoms.  Has been doing the exercises which they seem to help as well.  Shoulder pain is intermittent in nature.  She notices sometimes when she is holding her grandson..   Review of Systems  Constitutional: Negative for fever.  HENT: Negative for congestion.   Respiratory: Negative for cough.   Cardiovascular: Negative for chest pain.  Gastrointestinal: Negative for abdominal distention.  Musculoskeletal: Negative for arthralgias.  Skin: Negative for color change.  Neurological: Negative for weakness.  Hematological: Negative for adenopathy.    HISTORY: Past Medical, Surgical, Social, and Family History Reviewed & Updated per EMR.   Pertinent Historical Findings include:  Past Medical History:  Diagnosis Date  . Anxiety   . Depression     Past Surgical History:  Procedure Laterality Date  . CEREBRAL MICROVASCULAR DECOMPRESSION    . CESAREAN SECTION    . FOOT SURGERY      Allergies  Allergen Reactions  . Penicillins Hives    Family History  Problem Relation Age of Onset  . Cancer Father        lung     Social History   Socioeconomic History  . Marital status: Divorced    Spouse name: Not on file  . Number of children: Not on file  . Years of education: Not on file  . Highest education level: Not on file  Occupational History  . Not on file  Social Needs  . Financial resource strain: Not on file  . Food insecurity    Worry: Not on file    Inability: Not on file  .  Transportation needs    Medical: Not on file    Non-medical: Not on file  Tobacco Use  . Smoking status: Former Research scientist (life sciences)  . Smokeless tobacco: Never Used  Substance and Sexual Activity  . Alcohol use: Yes    Comment: occasionally   . Drug use: No  . Sexual activity: Yes    Birth control/protection: None  Lifestyle  . Physical activity    Days per week: Not on file    Minutes per session: Not on file  . Stress: Not on file  Relationships  . Social Herbalist on phone: Not on file    Gets together: Not on file    Attends religious service: Not on file    Active member of club or organization: Not on file    Attends meetings of clubs or organizations: Not on file    Relationship status: Not on file  . Intimate partner violence    Fear of current or ex partner: Not on file    Emotionally abused: Not on file    Physically abused: Not on file    Forced sexual activity: Not on file  Other Topics Concern  . Not on file  Social History Narrative  . Not on file     PHYSICAL EXAM:  VS: BP (!) 142/88   Pulse 62  Ht 5\' 6"  (1.676 m)   Wt 132 lb (59.9 kg)   BMI 21.31 kg/m  Physical Exam Gen: NAD, alert, cooperative with exam, well-appearing ENT: normal lips, normal nasal mucosa,  Eye: normal EOM, normal conjunctiva and lids CV:  no edema, +2 pedal pulses   Resp: no accessory muscle use, non-labored,  Skin: no rashes, no areas of induration  Neuro: normal tone, normal sensation to touch Psych:  normal insight, alert and oriented MSK:  Right shoulder: Normal internal and external rotation. Normal strength resistance. Normal active flexion abduction. Normal empty can test. Right hip: No tenderness palpation of the greater trochanter. Limited internal and external rotation compared to contralateral side. Normal strength resistance. Neurovascularly intact     ASSESSMENT & PLAN:   Rotator cuff tendinitis, right Improvement with exercises as well as  over-the-counter Voltaren. -Counseled on home exercise therapy and supportive care. -Could consider physical therapy or injection. -Follow-up as needed.  Trochanteric bursitis, right hip Improved since the injection.  She does have limitations with her range of motion which could be contributing. -Counseled on home exercise therapy and supportive care. -May need to consider imaging.

## 2019-07-19 ENCOUNTER — Ambulatory Visit (INDEPENDENT_AMBULATORY_CARE_PROVIDER_SITE_OTHER): Payer: Medicare HMO | Admitting: Neurology

## 2019-07-19 DIAGNOSIS — G44209 Tension-type headache, unspecified, not intractable: Secondary | ICD-10-CM | POA: Diagnosis not present

## 2019-07-19 NOTE — Progress Notes (Signed)
Virtual Visit via Telephone Note  I connected with Caitlyn Hill on 07/19/19 at  2:30 PM EST by telephone and verified that I am speaking with the correct person using two identifiers.  Location: Patient: at home Provider: at office   I discussed the limitations, risks, security and privacy concerns of performing an evaluation and management service by telephone and the availability of in person appointments. I also discussed with the patient that there may be a patient responsible charge related to this service. The patient expressed understanding and agreed to proceed. The patient was unable to log on to my chart scheduled video visit due to technical difficulty hence this visit was changed to phone visit upon patient request History of Present Illness: Patient is seen today for virtual phone visit after last visit  on 03/29/19. She states that her headaches are a lot better since starting topmax. She called me in September and after I increaded dose to 50 mg twice daily headaches are much better , milder and more tolerable. They occur now only couple of times  aweeek and are mostly on vertex and spread to the back of neck and sides. She finds tylenol does not work for her but oxycode does work but she cut back to only 1-2 times per weel. She is tolerating topmax quite well without side effects. hedaches are more noticeable when she is tired, exhausted or over worked. She is not doing any regular stress relaxation  activities on a dialy basis consistently but does do regular neck stretching exercises daily.She ran out of her zanaflex 3 weeks ago and it took so long to have it refilled and now she finally got them and plans to resume taking it now. Her trigeminal neuralgic pain has been stable and not recurred since her brain surgery years ago    Observations/Objective: exam not possible due to phone visit   Assessment and Plan:  15 year lady with tension headaches which appear stable on current  medication regimen. Remote trigeminal neuralgia now stable as well.   Follow Up Instructions: Continue Topamax in present dose of 50 mg twice dialy and resume taking zanaflex in prior dose. Regular neck stretching exercises and start participation in regular stress relaxation activities like daily exercises, meditation and yoga. Follow up in office in 6 months with my nurse practitioner Jesisca or call earlier if need   I discussed the assessment and treatment plan with the patient. The patient was provided an opportunity to ask questions and all were answered. The patient agreed with the plan and demonstrated an understanding of the instructions.   The patient was advised to call back or seek an in-person evaluation if the symptoms worsen or if the condition fails to improve as anticipated.  I provided 25 minutes of non-face-to-face time during this encounter.   Antony Contras, MD

## 2019-08-11 ENCOUNTER — Telehealth: Payer: Self-pay

## 2019-08-11 ENCOUNTER — Encounter: Payer: Self-pay | Admitting: Family Medicine

## 2019-08-11 ENCOUNTER — Other Ambulatory Visit: Payer: Self-pay

## 2019-08-11 DIAGNOSIS — M19241 Secondary osteoarthritis, right hand: Secondary | ICD-10-CM

## 2019-08-11 DIAGNOSIS — M62838 Other muscle spasm: Secondary | ICD-10-CM

## 2019-08-11 MED ORDER — TIZANIDINE HCL 4 MG PO TABS: ORAL_TABLET | ORAL | 1 refills | Status: DC

## 2019-08-11 MED ORDER — MELOXICAM 15 MG PO TABS: ORAL_TABLET | ORAL | 1 refills | Status: DC

## 2019-08-11 NOTE — Telephone Encounter (Signed)
error 

## 2019-08-26 ENCOUNTER — Encounter: Payer: Self-pay | Admitting: Family Medicine

## 2019-08-26 DIAGNOSIS — M19241 Secondary osteoarthritis, right hand: Secondary | ICD-10-CM

## 2019-08-26 MED ORDER — MELOXICAM 15 MG PO TABS: ORAL_TABLET | ORAL | 1 refills | Status: DC

## 2019-08-29 ENCOUNTER — Other Ambulatory Visit: Payer: Self-pay | Admitting: Neurology

## 2019-09-22 ENCOUNTER — Telehealth: Payer: Self-pay

## 2019-09-22 NOTE — Telephone Encounter (Signed)
If patient calls back please schedule her with Shanda Bumps NP in 6 months per Dr.SEthi. She needs to be schedule in June 2021 with JEssica NP.  I tried to call pt but her vm was full could not leave message.

## 2019-11-09 DIAGNOSIS — Z20828 Contact with and (suspected) exposure to other viral communicable diseases: Secondary | ICD-10-CM | POA: Diagnosis not present

## 2019-11-09 DIAGNOSIS — Z03818 Encounter for observation for suspected exposure to other biological agents ruled out: Secondary | ICD-10-CM | POA: Diagnosis not present

## 2019-12-02 ENCOUNTER — Other Ambulatory Visit: Payer: Self-pay | Admitting: Family Medicine

## 2019-12-02 DIAGNOSIS — M62838 Other muscle spasm: Secondary | ICD-10-CM

## 2019-12-18 ENCOUNTER — Other Ambulatory Visit: Payer: Self-pay | Admitting: Family Medicine

## 2019-12-19 ENCOUNTER — Encounter: Payer: Self-pay | Admitting: Family Medicine

## 2019-12-19 ENCOUNTER — Other Ambulatory Visit: Payer: Self-pay

## 2019-12-19 ENCOUNTER — Ambulatory Visit (INDEPENDENT_AMBULATORY_CARE_PROVIDER_SITE_OTHER): Payer: Medicare HMO | Admitting: Family Medicine

## 2019-12-19 VITALS — BP 138/84 | HR 60 | Temp 98.0°F | Ht 66.14 in | Wt 128.8 lb

## 2019-12-19 DIAGNOSIS — L659 Nonscarring hair loss, unspecified: Secondary | ICD-10-CM

## 2019-12-19 DIAGNOSIS — F5101 Primary insomnia: Secondary | ICD-10-CM

## 2019-12-19 DIAGNOSIS — M19242 Secondary osteoarthritis, left hand: Secondary | ICD-10-CM

## 2019-12-19 DIAGNOSIS — M62838 Other muscle spasm: Secondary | ICD-10-CM

## 2019-12-19 DIAGNOSIS — F418 Other specified anxiety disorders: Secondary | ICD-10-CM

## 2019-12-19 DIAGNOSIS — M19241 Secondary osteoarthritis, right hand: Secondary | ICD-10-CM | POA: Diagnosis not present

## 2019-12-19 DIAGNOSIS — R69 Illness, unspecified: Secondary | ICD-10-CM | POA: Diagnosis not present

## 2019-12-19 MED ORDER — TRAZODONE HCL 50 MG PO TABS: 50.0000 mg | ORAL_TABLET | Freq: Every evening | ORAL | 1 refills | Status: DC | PRN

## 2019-12-19 MED ORDER — MELOXICAM 15 MG PO TABS: ORAL_TABLET | ORAL | 1 refills | Status: DC

## 2019-12-19 MED ORDER — TIZANIDINE HCL 4 MG PO TABS: ORAL_TABLET | ORAL | 3 refills | Status: DC

## 2019-12-19 NOTE — Patient Instructions (Signed)
Great to see you today! Please have labs completed, we'll be in touch with results.  Let's plan to follow up in about 6 months for annual exam.

## 2019-12-19 NOTE — Progress Notes (Signed)
Caitlyn Hill - 64 y.o. female MRN [id number]  Date of birth: [birth year]  Subjective No chief complaint on file.   HPI Caitlyn Hill is a 64 y.o. female with history of migraines, insomnia, depression and anxiety here today for follow up visit. She has concerns of hair loss today.   -Hair loss:  Reports increased hair loss over the past couple of months.  Also reports brittle nails.   Denies changes to medications recently. No increased fatigue, weight change, changes to bowel or bladder.  She has noted some improvement with taking prenatal vitamin.    -Insomnia:  This remains well controlled with trazodone. No significant side effects at this time.   -Depression/anxiety:  Well managed with fluoxetine.  Denies any worsening symptoms or side effects from medication  ROS:  A comprehensive ROS was completed and negative except as noted per HPI  Allergies  Allergen Reactions  . Penicillins Hives    Past Medical History:  Diagnosis Date  . Anxiety   . Depression     Past Surgical History:  Procedure Laterality Date  . CEREBRAL MICROVASCULAR DECOMPRESSION    . CESAREAN SECTION    . FOOT SURGERY      Social History   Socioeconomic History  . Marital status: Divorced    Spouse name: Not on file  . Number of children: Not on file  . Years of education: Not on file  . Highest education level: Not on file  Occupational History  . Not on file  Tobacco Use  . Smoking status: Former Research scientist (life sciences)  . Smokeless tobacco: Never Used  Substance and Sexual Activity  . Alcohol use: Yes    Comment: occasionally   . Drug use: No  . Sexual activity: Yes    Birth control/protection: None  Other Topics Concern  . Not on file  Social History Narrative  . Not on file   Social Determinants of Health   Financial Resource Strain:   . Difficulty of Paying Living Expenses:   Food Insecurity:   . Worried About Charity fundraiser in the Last Year:   . Arboriculturist in the Last Year:    Transportation Needs:   . Film/video editor (Medical):   Marland Kitchen Lack of Transportation (Non-Medical):   Physical Activity:   . Days of Exercise per Week:   . Minutes of Exercise per Session:   Stress:   . Feeling of Stress :   Social Connections:   . Frequency of Communication with Friends and Family:   . Frequency of Social Gatherings with Friends and Family:   . Attends Religious Services:   . Active Member of Clubs or Organizations:   . Attends Archivist Meetings:   Marland Kitchen Marital Status:     Family History  Problem Relation Age of Onset  . Cancer Father        lung    Health Maintenance  Topic Date Due  . COVID-19 Vaccine (1) Never done  . TETANUS/TDAP  Never done  . MAMMOGRAM  Never done  . DEXA SCAN  Never done  . PNA vac Low Risk Adult (1 of 2 - PCV13) Never done  . COLONOSCOPY  06/06/2019  . INFLUENZA VACCINE  03/04/2020  . Hepatitis C Screening  Completed     ----------------------------------------------------------------------------------------------------------------------------------------------------------------------------------------------------------------- Physical Exam BP (!) 163/70 (BP Location: Left Arm, Patient Position: Sitting, Cuff Size: Small)   Pulse 71   Temp 98 F (36.7 C) (Oral)   Ht  5' 6.14" (1.68 m)   Wt 128 lb 12.8 oz (58.4 kg)   SpO2 99%   BMI 20.70 kg/m   Physical Exam Constitutional:      Appearance: Normal appearance.  HENT:     Head: Normocephalic and atraumatic.  Eyes:     General: No scleral icterus. Cardiovascular:     Rate and Rhythm: Normal rate and regular rhythm.  Pulmonary:     Effort: Pulmonary effort is normal.     Breath sounds: Normal breath sounds.  Musculoskeletal:     Cervical back: Neck supple.  Skin:    General: Skin is warm and dry.  Neurological:     General: No focal deficit present.     Mental Status: She is alert.  Psychiatric:        Mood and Affect: Mood normal.        Behavior:  Behavior normal.     ------------------------------------------------------------------------------------------------------------------------------------------------------------------------------------------------------------------- Assessment and Plan  Insomnia Doing well with trazodone, continue.   Depression with anxiety Stable with fluoxetine.  Continue at current dose.    Hair loss Check TSH Possibly related to topiramate use.  May continue vitamin supplement.     Meds ordered this encounter  Medications  . traZODone (DESYREL) 50 MG tablet    Sig: Take 1-2 tablets (50-100 mg total) by mouth at bedtime as needed for sleep.    Dispense:  180 tablet    Refill:  1  . tiZANidine (ZANAFLEX) 4 MG tablet    Sig: Take 1/2qam;take 1qhs    Dispense:  90 tablet    Refill:  3  . meloxicam (MOBIC) 15 MG tablet    Sig: TAKE 1 TABLET(15 MG) BY MOUTH DAILY    Dispense:  90 tablet    Refill:  1    **Patient requests 90 days supply**    Return in about 6 months (around 06/20/2020) for annual exam..    This visit occurred during the SARS-CoV-2 public health emergency.  Safety protocols were in place, including screening questions prior to the visit, additional usage of staff PPE, and extensive cleaning of exam room while observing appropriate contact time as indicated for disinfecting solutions.

## 2019-12-19 NOTE — Telephone Encounter (Signed)
Please see message and advise.  Thank you. ° °

## 2019-12-20 DIAGNOSIS — L659 Nonscarring hair loss, unspecified: Secondary | ICD-10-CM | POA: Insufficient documentation

## 2019-12-20 LAB — TSH: TSH: 1.39 mIU/L (ref 0.40–4.50)

## 2019-12-20 NOTE — Assessment & Plan Note (Signed)
Doing well with trazodone, continue.  

## 2019-12-20 NOTE — Assessment & Plan Note (Signed)
Stable with fluoxetine.  Continue at current dose.

## 2019-12-20 NOTE — Assessment & Plan Note (Signed)
Check TSH Possibly related to topiramate use.  May continue vitamin supplement.

## 2019-12-21 ENCOUNTER — Encounter: Payer: Self-pay | Admitting: Family Medicine

## 2020-01-06 ENCOUNTER — Other Ambulatory Visit: Payer: Self-pay | Admitting: Family Medicine

## 2020-01-06 NOTE — Telephone Encounter (Signed)
Trazodone 50 mg on med list, not 100 mg.   Last OV 12/19/19 Last refill of Trazodone 50 mg 12/19/19 #180/1 Next OV 06/18/20

## 2020-01-17 ENCOUNTER — Ambulatory Visit: Payer: Medicare HMO | Admitting: Adult Health

## 2020-02-21 ENCOUNTER — Other Ambulatory Visit: Payer: Self-pay | Admitting: Neurology

## 2020-05-16 ENCOUNTER — Encounter: Payer: Self-pay | Admitting: Family Medicine

## 2020-05-18 ENCOUNTER — Encounter: Payer: Self-pay | Admitting: Family Medicine

## 2020-05-18 ENCOUNTER — Ambulatory Visit (INDEPENDENT_AMBULATORY_CARE_PROVIDER_SITE_OTHER): Payer: Medicare HMO | Admitting: Family Medicine

## 2020-05-18 DIAGNOSIS — G44221 Chronic tension-type headache, intractable: Secondary | ICD-10-CM

## 2020-05-18 DIAGNOSIS — F330 Major depressive disorder, recurrent, mild: Secondary | ICD-10-CM

## 2020-05-18 DIAGNOSIS — M62838 Other muscle spasm: Secondary | ICD-10-CM

## 2020-05-18 DIAGNOSIS — R69 Illness, unspecified: Secondary | ICD-10-CM | POA: Diagnosis not present

## 2020-05-18 DIAGNOSIS — M19241 Secondary osteoarthritis, right hand: Secondary | ICD-10-CM | POA: Diagnosis not present

## 2020-05-18 DIAGNOSIS — M19242 Secondary osteoarthritis, left hand: Secondary | ICD-10-CM

## 2020-05-18 MED ORDER — CYCLOBENZAPRINE HCL 10 MG PO TABS: ORAL_TABLET | ORAL | 3 refills | Status: DC

## 2020-05-18 MED ORDER — KETOROLAC TROMETHAMINE 60 MG/2ML IM SOLN
60.0000 mg | Freq: Once | INTRAMUSCULAR | Status: AC
  Administered 2020-05-18: 60 mg via INTRAMUSCULAR

## 2020-05-18 MED ORDER — PREDNISONE 10 MG (21) PO TBPK
ORAL_TABLET | ORAL | 0 refills | Status: DC
Start: 2020-05-18 — End: 2020-07-06

## 2020-05-18 MED ORDER — NURTEC 75 MG PO TBDP: 1.0000 | ORAL_TABLET | Freq: Every day | ORAL | 1 refills | Status: DC

## 2020-05-18 MED ORDER — TIZANIDINE HCL 4 MG PO TABS: ORAL_TABLET | ORAL | 3 refills | Status: DC

## 2020-05-18 MED ORDER — MELOXICAM 15 MG PO TABS: ORAL_TABLET | ORAL | 1 refills | Status: DC

## 2020-05-18 NOTE — Progress Notes (Signed)
Called to pharmacy 

## 2020-05-18 NOTE — Patient Instructions (Signed)
Start prednisone taper.  Take flexeril during the day and tizanidine as needed at night.  Use meloxicam as needed.  New rx for nurtec sent in for migraine prevention.  See me again in about 6 weeks.

## 2020-05-19 ENCOUNTER — Other Ambulatory Visit: Payer: Self-pay | Admitting: Family Medicine

## 2020-05-21 DIAGNOSIS — F329 Major depressive disorder, single episode, unspecified: Secondary | ICD-10-CM | POA: Insufficient documentation

## 2020-05-21 DIAGNOSIS — F3341 Major depressive disorder, recurrent, in partial remission: Secondary | ICD-10-CM | POA: Insufficient documentation

## 2020-05-21 NOTE — Assessment & Plan Note (Signed)
She will continue fluoxetine.

## 2020-05-21 NOTE — Progress Notes (Signed)
Caitlyn Hill - 64 y.o. female MRN [id number]  Date of birth: 05/17/1956  Subjective Chief Complaint  Patient presents with  . neck and shoulder tension    HPI Caitlyn Hill is a 64 y.o. female here today with complaint of increased neck and shoulder pain.  She has history of chronic neck pain as well as migraines.  She states that her neck pain has triggered more frequent migraines recently.  She reports that she has been under a lot of stress as her boyfriend has not been in good health and she is caring for her elderly mother.  Her headaches were better controlled when she was taking topiramate however she discontinued this due to hair loss.  She does get relief with tizanidine but can only take this at night due to sedation.  She has used flexeril during the day in the past and this have not made her sleepy.    ROS:  A comprehensive ROS was completed and negative except as noted per HPI  Allergies  Allergen Reactions  . Penicillins Hives    Past Medical History:  Diagnosis Date  . Anxiety   . Depression     Past Surgical History:  Procedure Laterality Date  . CEREBRAL MICROVASCULAR DECOMPRESSION    . CESAREAN SECTION    . FOOT SURGERY      Social History   Socioeconomic History  . Marital status: Divorced    Spouse name: Not on file  . Number of children: Not on file  . Years of education: Not on file  . Highest education level: Not on file  Occupational History  . Not on file  Tobacco Use  . Smoking status: Former Games developer  . Smokeless tobacco: Never Used  Substance and Sexual Activity  . Alcohol use: Yes    Comment: occasionally   . Drug use: No  . Sexual activity: Yes    Birth control/protection: None  Other Topics Concern  . Not on file  Social History Narrative  . Not on file   Social Determinants of Health   Financial Resource Strain:   . Difficulty of Paying Living Expenses: Not on file  Food Insecurity:   . Worried About Programme researcher, broadcasting/film/video in the  Last Year: Not on file  . Ran Out of Food in the Last Year: Not on file  Transportation Needs:   . Lack of Transportation (Medical): Not on file  . Lack of Transportation (Non-Medical): Not on file  Physical Activity:   . Days of Exercise per Week: Not on file  . Minutes of Exercise per Session: Not on file  Stress:   . Feeling of Stress : Not on file  Social Connections:   . Frequency of Communication with Friends and Family: Not on file  . Frequency of Social Gatherings with Friends and Family: Not on file  . Attends Religious Services: Not on file  . Active Member of Clubs or Organizations: Not on file  . Attends Banker Meetings: Not on file  . Marital Status: Not on file    Family History  Problem Relation Age of Onset  . Cancer Father        lung    Health Maintenance  Topic Date Due  . COVID-19 Vaccine (1) Never done  . TETANUS/TDAP  Never done  . MAMMOGRAM  Never done  . DEXA SCAN  Never done  . PNA vac Low Risk Adult (1 of 2 - PCV13) Never done  .  COLONOSCOPY  06/06/2019  . INFLUENZA VACCINE  Never done  . Hepatitis C Screening  Completed     ----------------------------------------------------------------------------------------------------------------------------------------------------------------------------------------------------------------- Physical Exam BP (!) 163/83 (BP Location: Left Arm, Patient Position: Sitting, Cuff Size: Small)   Pulse 66   Temp 98.4 F (36.9 C)   Wt 130 lb (59 kg)   SpO2 100%   BMI 20.89 kg/m   Physical Exam Constitutional:      Appearance: Normal appearance.  HENT:     Head: Normocephalic and atraumatic.  Eyes:     General: No scleral icterus. Cardiovascular:     Rate and Rhythm: Normal rate and regular rhythm.  Pulmonary:     Effort: Pulmonary effort is normal.     Breath sounds: Normal breath sounds.  Musculoskeletal:     Cervical back: Neck supple.  Neurological:     General: No focal deficit  present.     Mental Status: She is alert.  Psychiatric:        Mood and Affect: Mood normal.        Behavior: Behavior normal.     ------------------------------------------------------------------------------------------------------------------------------------------------------------------------------------------------------------------- Assessment and Plan  Chronic tension headache Chronic tension headaches with migrainous features.  Recent worsening due to increased stress.   Toradol 60mg  given today.  Will provide trial of nurtec daily to see if this is helpful in prevention of her recurrent headaches.  Continue tinzanidine at night and will add flexeril during this day as this does not make her drowsy.  Continue meloxicam.  Return in about 6 weeks (around 06/29/2020) for Migraines.    Major depression She will continue fluoxetine.     Meds ordered this encounter  Medications  . predniSONE (STERAPRED UNI-PAK 21 TAB) 10 MG (21) TBPK tablet    Sig: Taper as directed on packaging.    Dispense:  21 tablet    Refill:  0  . cyclobenzaprine (FLEXERIL) 10 MG tablet    Sig: Take once during the day as needed for spasm.    Dispense:  30 tablet    Refill:  3  . Rimegepant Sulfate (NURTEC) 75 MG TBDP    Sig: Take 1 tablet by mouth daily. For prevention of migraine    Dispense:  90 tablet    Refill:  1  . tiZANidine (ZANAFLEX) 4 MG tablet    Sig: take 1qhs    Dispense:  90 tablet    Refill:  3  . meloxicam (MOBIC) 15 MG tablet    Sig: TAKE 1 TABLET(15 MG) BY MOUTH DAILY    Dispense:  90 tablet    Refill:  1    **Patient requests 90 days supply**  . ketorolac (TORADOL) injection 60 mg    Return in about 6 weeks (around 06/29/2020) for Migraines.    This visit occurred during the SARS-CoV-2 public health emergency.  Safety protocols were in place, including screening questions prior to the visit, additional usage of staff PPE, and extensive cleaning of exam room while  observing appropriate contact time as indicated for disinfecting solutions.

## 2020-05-21 NOTE — Assessment & Plan Note (Signed)
Chronic tension headaches with migrainous features.  Recent worsening due to increased stress.   Toradol 60mg  given today.  Will provide trial of nurtec daily to see if this is helpful in prevention of her recurrent headaches.  Continue tinzanidine at night and will add flexeril during this day as this does not make her drowsy.  Continue meloxicam.  Return in about 6 weeks (around 06/29/2020) for Migraines.

## 2020-05-22 ENCOUNTER — Encounter: Payer: Self-pay | Admitting: Family Medicine

## 2020-05-22 NOTE — Telephone Encounter (Signed)
The Nurtec has been approved.

## 2020-05-24 ENCOUNTER — Telehealth: Payer: Self-pay

## 2020-05-24 NOTE — Telephone Encounter (Signed)
PA for Nurtec and Cyclobenzaprine approved.

## 2020-06-10 ENCOUNTER — Other Ambulatory Visit: Payer: Self-pay | Admitting: Family Medicine

## 2020-06-18 ENCOUNTER — Ambulatory Visit: Payer: Medicare HMO | Admitting: Family Medicine

## 2020-07-06 ENCOUNTER — Ambulatory Visit (INDEPENDENT_AMBULATORY_CARE_PROVIDER_SITE_OTHER): Payer: Medicare HMO | Admitting: Family Medicine

## 2020-07-06 ENCOUNTER — Other Ambulatory Visit: Payer: Self-pay

## 2020-07-06 ENCOUNTER — Encounter: Payer: Self-pay | Admitting: Family Medicine

## 2020-07-06 VITALS — BP 148/88 | HR 84 | Temp 98.4°F | Ht 66.54 in | Wt 131.7 lb

## 2020-07-06 DIAGNOSIS — Z1211 Encounter for screening for malignant neoplasm of colon: Secondary | ICD-10-CM

## 2020-07-06 DIAGNOSIS — Z780 Asymptomatic menopausal state: Secondary | ICD-10-CM

## 2020-07-06 DIAGNOSIS — G44221 Chronic tension-type headache, intractable: Secondary | ICD-10-CM | POA: Diagnosis not present

## 2020-07-06 DIAGNOSIS — Z0000 Encounter for general adult medical examination without abnormal findings: Secondary | ICD-10-CM

## 2020-07-06 DIAGNOSIS — Z13220 Encounter for screening for lipoid disorders: Secondary | ICD-10-CM

## 2020-07-06 DIAGNOSIS — F418 Other specified anxiety disorders: Secondary | ICD-10-CM

## 2020-07-06 DIAGNOSIS — E7800 Pure hypercholesterolemia, unspecified: Secondary | ICD-10-CM | POA: Diagnosis not present

## 2020-07-06 DIAGNOSIS — R69 Illness, unspecified: Secondary | ICD-10-CM | POA: Diagnosis not present

## 2020-07-06 MED ORDER — FLUOXETINE HCL 20 MG PO TABS
40.0000 mg | ORAL_TABLET | Freq: Every day | ORAL | 1 refills | Status: DC
Start: 2020-07-06 — End: 2021-01-02

## 2020-07-06 MED ORDER — GABAPENTIN 300 MG PO CAPS: 300.0000 mg | ORAL_CAPSULE | Freq: Three times a day (TID) | ORAL | 3 refills | Status: DC

## 2020-07-06 NOTE — Patient Instructions (Signed)
Take fluoxetine 2 tablet (40mg  daily) Try gabapentin 300mg  at night for the first 7-10 days then increase to three times per day if needed.  See me again in about 2 months.

## 2020-07-06 NOTE — Assessment & Plan Note (Signed)
She will continue on fluoxetine 40mg  daily for now.

## 2020-07-06 NOTE — Assessment & Plan Note (Signed)
Discussed options and will try adding on gabapentin.  We also discussed possibly weaning from fluoxetine and changing to cymbalta or elavil.  Return in about 8 weeks (around 08/31/2020) for headaches.

## 2020-07-06 NOTE — Progress Notes (Signed)
Caitlyn Hill - 65 y.o. female MRN [id number]  Date of birth: 10-20-1955  Subjective Chief Complaint  Patient presents with  . Migraine    HPI Caitlyn Hill is a 64 y.o. female here today for annual exam.  She would also like to discuss stress and migraines.  She reports continued stress related to her boyfriend.  She states that he lays in bed and is bascially just "wasting away".  He has CKD IV and HTN and DM that he doesn't take care of.  He has not really been out of bed since August.  This along with being off of topiramate has triggered increased headaches.  She was unable to tolerate topiramate due to hair loss.  Nurtec was too expensive  She is taking tizanidine at night and reports that she is taking fluoxetine 40mg  daily. She does find joy in caring for her Grandson.   She is due for updated colonoscopy and DEXA scan.  She had Mammogram completed at GYN office.   She is a former smoker and consumes EtOH occasionally.   She feels like she stays pretty active.    She is not interested in any immunizations at this time.   Review of Systems  Constitutional: Negative for chills, fever, malaise/fatigue and weight loss.  HENT: Negative for congestion, ear pain and sore throat.   Eyes: Negative for blurred vision, double vision and pain.  Respiratory: Negative for cough and shortness of breath.   Cardiovascular: Negative for chest pain and palpitations.  Gastrointestinal: Negative for abdominal pain, blood in stool, constipation, heartburn and nausea.  Genitourinary: Negative for dysuria and urgency.  Musculoskeletal: Negative for joint pain and myalgias.  Neurological: Positive for headaches. Negative for dizziness.  Endo/Heme/Allergies: Does not bruise/bleed easily.  Psychiatric/Behavioral: Negative for depression. The patient is nervous/anxious. The patient does not have insomnia.     Allergies  Allergen Reactions  . Penicillins Hives    Past Medical History:  Diagnosis  Date  . Anxiety   . Depression     Past Surgical History:  Procedure Laterality Date  . CEREBRAL MICROVASCULAR DECOMPRESSION    . CESAREAN SECTION    . FOOT SURGERY      Social History   Socioeconomic History  . Marital status: Divorced    Spouse name: Not on file  . Number of children: Not on file  . Years of education: Not on file  . Highest education level: Not on file  Occupational History  . Not on file  Tobacco Use  . Smoking status: Former  . Smokeless tobacco: Never Used  Substance and Sexual Activity  . Alcohol use: Yes    Comment: occasionally   . Drug use: No  . Sexual activity: Yes    Birth control/protection: None  Other Topics Concern  . Not on file  Social History Narrative  . Not on file   Social Determinants of Health   Financial Resource Strain:   . Difficulty of Paying Living Expenses: Not on file  Food Insecurity:   . Worried About Games developer in the Last Year: Not on file  . Ran Out of Food in the Last Year: Not on file  Transportation Needs:   . Lack of Transportation (Medical): Not on file  . Lack of Transportation (Non-Medical): Not on file  Physical Activity:   . Days of Exercise per Week: Not on file  . Minutes of Exercise per Session: Not on file  Stress:   .  Feeling of Stress : Not on file  Social Connections:   . Frequency of Communication with Friends and Family: Not on file  . Frequency of Social Gatherings with Friends and Family: Not on file  . Attends Religious Services: Not on file  . Active Member of Clubs or Organizations: Not on file  . Attends Banker Meetings: Not on file  . Marital Status: Not on file    Family History  Problem Relation Age of Onset  . Cancer Father        lung    Health Maintenance  Topic Date Due  . MAMMOGRAM  Never done  . DEXA SCAN  Never done  . COLONOSCOPY  06/06/2019  . COVID-19 Vaccine (1) 07/22/2020 (Originally 08/21/1967)  . INFLUENZA VACCINE   11/01/2020 (Originally 03/04/2020)  . TETANUS/TDAP  07/06/2021 (Originally 08/21/1972)  . PNA vac Low Risk Adult (1 of 2 - PCV13) 07/06/2021 (Originally 08/21/2018)  . Hepatitis C Screening  Completed     ----------------------------------------------------------------------------------------------------------------------------------------------------------------------------------------------------------------- Physical Exam BP (!) 148/88   Pulse 84   Temp 98.4 F (36.9 C)   Ht 5' 6.54" (1.69 m)   Wt 131 lb 11.2 oz (59.7 kg)   SpO2 99%   BMI 20.92 kg/m   Physical Exam Constitutional:      General: She is not in acute distress. HENT:     Head: Normocephalic and atraumatic.     Nose: Nose normal.  Eyes:     General: No scleral icterus.    Conjunctiva/sclera: Conjunctivae normal.  Neck:     Thyroid: No thyromegaly.  Cardiovascular:     Rate and Rhythm: Normal rate and regular rhythm.     Heart sounds: Normal heart sounds.  Pulmonary:     Effort: Pulmonary effort is normal.     Breath sounds: Normal breath sounds.  Abdominal:     General: Bowel sounds are normal. There is no distension.     Palpations: Abdomen is soft.     Tenderness: There is no abdominal tenderness. There is no guarding.  Musculoskeletal:        General: Normal range of motion.     Cervical back: Normal range of motion and neck supple.  Lymphadenopathy:     Cervical: No cervical adenopathy.  Skin:    General: Skin is warm and dry.     Findings: No rash.  Neurological:     Mental Status: She is alert and oriented to person, place, and time.     Cranial Nerves: No cranial nerve deficit.     Coordination: Coordination normal.  Psychiatric:        Behavior: Behavior normal.     ------------------------------------------------------------------------------------------------------------------------------------------------------------------------------------------------------------------- Assessment and  Plan  Well adult exam See additional A&P as well for other problems addressed today.  Orders Placed This Encounter  Procedures  . COMPLETE METABOLIC PANEL WITH GFR  . CBC  . Lipid Profile  Screening:  DEXA and colonoscopy referral made Immunizations: counseled on recommended immunizations, declines at this time.  Anticipatory guidance/Risk factor reduction:  Recommendations per AVS  Chronic tension headache Discussed options and will try adding on gabapentin.  We also discussed possibly weaning from fluoxetine and changing to cymbalta or elavil.  Return in about 8 weeks (around 08/31/2020) for headaches.   Depression with anxiety She will continue on fluoxetine 40mg  daily for now.    Meds ordered this encounter  Medications  . gabapentin (NEURONTIN) 300 MG capsule    Sig: Take 1 capsule (300 mg total)  by mouth 3 (three) times daily. Start 300mg  qhs x1 week then increased to TID    Dispense:  90 capsule    Refill:  3  . FLUoxetine (PROZAC) 20 MG tablet    Sig: Take 2 tablets (40 mg total) by mouth daily.    Dispense:  180 tablet    Refill:  1    Return in about 8 weeks (around 08/31/2020) for headaches.    This visit occurred during the SARS-CoV-2 public health emergency.  Safety protocols were in place, including screening questions prior to the visit, additional usage of staff PPE, and extensive cleaning of exam room while observing appropriate contact time as indicated for disinfecting solutions.

## 2020-07-06 NOTE — Assessment & Plan Note (Signed)
See additional A&P as well for other problems addressed today.  Orders Placed This Encounter  Procedures  . COMPLETE METABOLIC PANEL WITH GFR  . CBC  . Lipid Profile  Screening:  DEXA and colonoscopy referral made Immunizations: counseled on recommended immunizations, declines at this time.  Anticipatory guidance/Risk factor reduction:  Recommendations per AVS

## 2020-07-07 LAB — CBC
HCT: 42.9 % (ref 35.0–45.0)
Hemoglobin: 14.9 g/dL (ref 11.7–15.5)
MCH: 31.8 pg (ref 27.0–33.0)
MCHC: 34.7 g/dL (ref 32.0–36.0)
MCV: 91.5 fL (ref 80.0–100.0)
MPV: 9.9 fL (ref 7.5–12.5)
Platelets: 217 10*3/uL (ref 140–400)
RBC: 4.69 10*6/uL (ref 3.80–5.10)
RDW: 11.6 % (ref 11.0–15.0)
WBC: 7.6 10*3/uL (ref 3.8–10.8)

## 2020-07-07 LAB — COMPLETE METABOLIC PANEL WITH GFR
AG Ratio: 2.2 (calc) (ref 1.0–2.5)
ALT: 20 U/L (ref 6–29)
AST: 23 U/L (ref 10–35)
Albumin: 4.8 g/dL (ref 3.6–5.1)
Alkaline phosphatase (APISO): 57 U/L (ref 37–153)
BUN: 10 mg/dL (ref 7–25)
CO2: 28 mmol/L (ref 20–32)
Calcium: 10.4 mg/dL (ref 8.6–10.4)
Chloride: 102 mmol/L (ref 98–110)
Creat: 0.73 mg/dL (ref 0.50–0.99)
GFR, Est African American: 99 mL/min/{1.73_m2} (ref >=60)
GFR, Est Non African American: 86 mL/min/{1.73_m2} (ref >=60)
Globulin: 2.2 g/dL (calc) (ref 1.9–3.7)
Glucose, Bld: 91 mg/dL (ref 65–99)
Potassium: 4 mmol/L (ref 3.5–5.3)
Sodium: 139 mmol/L (ref 135–146)
Total Bilirubin: 0.6 mg/dL (ref 0.2–1.2)
Total Protein: 7 g/dL (ref 6.1–8.1)

## 2020-07-07 LAB — LIPID PANEL
Cholesterol: 247 mg/dL — ABNORMAL HIGH (ref <200)
HDL: 73 mg/dL (ref >=50)
LDL Cholesterol (Calc): 145 mg/dL (calc) — ABNORMAL HIGH
Non-HDL Cholesterol (Calc): 174 mg/dL (calc) — ABNORMAL HIGH (ref <130)
Total CHOL/HDL Ratio: 3.4 (calc) (ref <5.0)
Triglycerides: 158 mg/dL — ABNORMAL HIGH (ref <150)

## 2020-07-16 ENCOUNTER — Encounter: Payer: Self-pay | Admitting: Family Medicine

## 2020-07-16 ENCOUNTER — Other Ambulatory Visit: Payer: Self-pay | Admitting: Family Medicine

## 2020-07-16 MED ORDER — ATORVASTATIN CALCIUM 20 MG PO TABS: 20.0000 mg | ORAL_TABLET | Freq: Every day | ORAL | 3 refills | Status: DC

## 2020-08-10 ENCOUNTER — Telehealth: Payer: Self-pay | Admitting: Neurology

## 2020-08-10 NOTE — Telephone Encounter (Signed)
Prior Authorization for Nurtec submitted via covermymeds.  "The patient currently has access to the requested medication and a Prior Authorization is not needed for the patient/medication."

## 2020-08-13 ENCOUNTER — Encounter: Payer: Self-pay | Admitting: Family Medicine

## 2020-08-27 ENCOUNTER — Encounter: Payer: Self-pay | Admitting: Family Medicine

## 2020-08-31 ENCOUNTER — Encounter: Payer: Self-pay | Admitting: Family Medicine

## 2020-08-31 ENCOUNTER — Ambulatory Visit (INDEPENDENT_AMBULATORY_CARE_PROVIDER_SITE_OTHER): Payer: Medicare HMO | Admitting: Family Medicine

## 2020-08-31 ENCOUNTER — Other Ambulatory Visit: Payer: Self-pay | Admitting: Family Medicine

## 2020-08-31 ENCOUNTER — Other Ambulatory Visit: Payer: Self-pay

## 2020-08-31 VITALS — BP 138/77 | HR 86 | Wt 139.7 lb

## 2020-08-31 DIAGNOSIS — M25511 Pain in right shoulder: Secondary | ICD-10-CM | POA: Diagnosis not present

## 2020-08-31 DIAGNOSIS — R69 Illness, unspecified: Secondary | ICD-10-CM | POA: Diagnosis not present

## 2020-08-31 DIAGNOSIS — M25512 Pain in left shoulder: Secondary | ICD-10-CM

## 2020-08-31 DIAGNOSIS — F5101 Primary insomnia: Secondary | ICD-10-CM

## 2020-08-31 DIAGNOSIS — M542 Cervicalgia: Secondary | ICD-10-CM

## 2020-08-31 DIAGNOSIS — G44221 Chronic tension-type headache, intractable: Secondary | ICD-10-CM | POA: Diagnosis not present

## 2020-08-31 DIAGNOSIS — F3341 Major depressive disorder, recurrent, in partial remission: Secondary | ICD-10-CM

## 2020-08-31 DIAGNOSIS — M7581 Other shoulder lesions, right shoulder: Secondary | ICD-10-CM

## 2020-08-31 MED ORDER — GABAPENTIN 300 MG PO CAPS: 300.0000 mg | ORAL_CAPSULE | Freq: Three times a day (TID) | ORAL | 3 refills | Status: DC

## 2020-08-31 MED ORDER — CYCLOBENZAPRINE HCL 10 MG PO TABS
ORAL_TABLET | ORAL | 3 refills | Status: DC
Start: 2020-08-31 — End: 2021-01-10

## 2020-08-31 MED ORDER — NURTEC 75 MG PO TBDP
1.0000 | ORAL_TABLET | ORAL | 1 refills | Status: DC
Start: 2020-08-31 — End: 2020-09-26

## 2020-08-31 NOTE — Patient Instructions (Signed)
Great to see you today! You should receive a call to set up physical therapy appointment.  Continue current medications.  See me again in about 4 months.

## 2020-09-02 NOTE — Assessment & Plan Note (Signed)
Depression remains well controlled with fluoxetine.  Continue at current strength. Continue trazodone for associated insomnia.

## 2020-09-02 NOTE — Progress Notes (Signed)
Caitlyn Hill - 65 y.o. female MRN [id number]  Date of birth: [birth year]  Subjective No chief complaint on file.   HPI Caitlyn Hill is a 65 y.o. female here today for follow up visit.  She has a history of chronic headaches, depression with anxiety, and HLD.   She has history of chronic headaches which seem like a combination of tension type headaches and migraines. She has history of trigeminal neuralgia as well.   She has tried several medications.  We were able to wean her from opioids.  Element of analgesic rebound likely contributing to her headaches previously.  Currently managing with a combination of gabapentin and muscle relaxers.  Nurtec was too expensive for her.  She feels like gabapentin has been very helpful for management of her symptoms.  Still complains of stiffness in neck an shoulders.    Depression remains pretty well managed with fluoxetine.  She continues on trazodone at bedtime as well.  No significant side effects at this time.    Depression screen [facility] 2/9 08/31/2020 07/06/2020 12/19/2019  Decreased Interest 1 2 1   Down, Depressed, Hopeless 1 3 1   PHQ - 2 Score 2 5 2   Altered sleeping 0 0 0  Tired, decreased energy 0 2 1  Change in appetite 0 0 1  Feeling bad or failure about yourself  0 0 0  Trouble concentrating 0 0 0  Moving slowly or fidgety/restless 0 0 0  Suicidal thoughts 0 0 0  PHQ-9 Score 2 7 4   Difficult doing work/chores Somewhat difficult Somewhat difficult Not difficult at all   GAD 7 : Generalized Anxiety Score 08/31/2020 07/06/2020 12/19/2019 04/26/2019  Nervous, Anxious, on Edge 0 3 0 1  Control/stop worrying 1 3 1 2   Worry too much - different things 1 3 0 2  Trouble relaxing 1 3 0 2  Restless 1 1 0 0  Easily annoyed or irritable 0 0 0 0  Afraid - awful might happen 1 3 0 1  Total GAD 7 Score 5 16 1 8   Anxiety Difficulty Somewhat difficult Somewhat difficult Not difficult at all -    ROS:  A comprehensive ROS was completed and negative except as  noted per HPI   Allergies  Allergen Reactions  . Penicillins Hives    Past Medical History:  Diagnosis Date  . Anxiety   . Depression     Past Surgical History:  Procedure Laterality Date  . CEREBRAL MICROVASCULAR DECOMPRESSION    . CESAREAN SECTION    . FOOT SURGERY      Social History   Socioeconomic History  . Marital status: Divorced    Spouse name: Not on file  . Number of children: Not on file  . Years of education: Not on file  . Highest education level: Not on file  Occupational History  . Not on file  Tobacco Use  . Smoking status: Former 09/02/2020  . Smokeless tobacco: Never Used  Substance and Sexual Activity  . Alcohol use: Yes    Comment: occasionally   . Drug use: No  . Sexual activity: Yes    Birth control/protection: None  Other Topics Concern  . Not on file  Social History Narrative  . Not on file   Social Determinants of Health   Financial Resource Strain: Not on file  Food Insecurity: Not on file  Transportation Needs: Not on file  Physical Activity: Not on file  Stress: Not on file  Social Connections: Not on  file    Family History  Problem Relation Age of Onset  . Cancer Father        lung    Health Maintenance  Topic Date Due  . COVID-19 Vaccine (1) Never done  . MAMMOGRAM  Never done  . DEXA SCAN  Never done  . COLONOSCOPY (Pts 45-22yrs Insurance coverage will need to be confirmed)  06/06/2019  . INFLUENZA VACCINE  11/01/2020 (Originally 03/04/2020)  . TETANUS/TDAP  07/06/2021 (Originally 08/21/1972)  . PNA vac Low Risk Adult (1 of 2 - PCV13) 07/06/2021 (Originally 08/21/2018)  . Hepatitis C Screening  Completed     ----------------------------------------------------------------------------------------------------------------------------------------------------------------------------------------------------------------- Physical Exam BP 138/77 (BP Location: Left Arm, Patient Position: Sitting, Cuff Size: Normal)   Pulse 86    Wt 139 lb 11.2 oz (63.4 kg)   SpO2 98%   BMI 22.19 kg/m   Physical Exam Constitutional:      Appearance: Normal appearance.  HENT:     Head: Normocephalic and atraumatic.  Eyes:     General: No scleral icterus. Cardiovascular:     Rate and Rhythm: Normal rate and regular rhythm.  Pulmonary:     Effort: Pulmonary effort is normal.     Breath sounds: Normal breath sounds.  Musculoskeletal:     Cervical back: Neck supple.  Neurological:     General: No focal deficit present.     Mental Status: She is alert.  Psychiatric:        Mood and Affect: Mood normal.        Behavior: Behavior normal.     ------------------------------------------------------------------------------------------------------------------------------------------------------------------------------------------------------------------- Assessment and Plan  Chronic tension headache Gabapentin has been quite helpful for her.  Will continue at current strength along with flexeril as needed.    Rotator cuff tendinitis, right Having neck and shoulder stiffness, referral entered to PT.  Recurrent major depression in partial remission (HCC) Depression remains well controlled with fluoxetine.  Continue at current strength. Continue trazodone for associated insomnia.    Insomnia Continue trazodone.     Meds ordered this encounter  Medications  . Rimegepant Sulfate (NURTEC) 75 MG TBDP    Sig: Take 1 tablet by mouth every other day. For prevention of migraine    Dispense:  45 tablet    Refill:  1  . cyclobenzaprine (FLEXERIL) 10 MG tablet    Sig: Take once during the day as needed for spasm.    Dispense:  30 tablet    Refill:  3  . gabapentin (NEURONTIN) 300 MG capsule    Sig: Take 1 capsule (300 mg total) by mouth 3 (three) times daily. Start 300mg  qhs x1 week then increased to TID    Dispense:  90 capsule    Refill:  3    Return in about 4 months (around 12/29/2020) for HA/MDD.    This visit  occurred during the SARS-CoV-2 public health emergency.  Safety protocols were in place, including screening questions prior to the visit, additional usage of staff PPE, and extensive cleaning of exam room while observing appropriate contact time as indicated for disinfecting solutions.

## 2020-09-02 NOTE — Assessment & Plan Note (Signed)
Having neck and shoulder stiffness, referral entered to PT.

## 2020-09-02 NOTE — Assessment & Plan Note (Signed)
Gabapentin has been quite helpful for her.  Will continue at current strength along with flexeril as needed.

## 2020-09-02 NOTE — Assessment & Plan Note (Signed)
Continue trazodone 

## 2020-09-07 ENCOUNTER — Other Ambulatory Visit: Payer: Self-pay

## 2020-09-07 ENCOUNTER — Ambulatory Visit: Payer: Medicare HMO | Attending: Family Medicine | Admitting: Physical Therapy

## 2020-09-07 ENCOUNTER — Encounter: Payer: Self-pay | Admitting: Physical Therapy

## 2020-09-07 DIAGNOSIS — M542 Cervicalgia: Secondary | ICD-10-CM

## 2020-09-07 DIAGNOSIS — M25512 Pain in left shoulder: Secondary | ICD-10-CM | POA: Diagnosis not present

## 2020-09-07 DIAGNOSIS — R252 Cramp and spasm: Secondary | ICD-10-CM

## 2020-09-07 DIAGNOSIS — M6281 Muscle weakness (generalized): Secondary | ICD-10-CM | POA: Diagnosis not present

## 2020-09-07 NOTE — Patient Instructions (Signed)
Access Code: [email or web address] URL: [email or web address] Date: 09/07/2020 Prepared by: Lysle Rubens  Exercises Seated Scapular Retraction - 1 x daily - 7 x weekly - 3 sets - 10 reps - 3 sec hold Seated Upper Trapezius Stretch - 1 x daily - 7 x weekly - 3 sets - 2 reps - 20-30 sec hold Seated Levator Scapulae Stretch - 1 x daily - 7 x weekly - 3 sets - 2 reps - 20-30 sec hold Standing Lower Cervical and Upper Thoracic Stretch - 1 x daily - 7 x weekly - 3 sets - 5 reps - 5-10 sec hold

## 2020-09-07 NOTE — Therapy (Signed)
[facility] [address] [place], [place], [address] Phone: [phone]   Fax:  [phone]  Physical Therapy Evaluation  Patient Details  Name: Caitlyn Hill MRN: [id number] Date of Birth: May 20, 1956 Referring Provider (PT): Ashley Royalty   Encounter Date: 09/07/2020   PT End of Session - 09/07/20 1218    Visit Number 1    Date for PT Re-Evaluation 11/05/20    PT Start Time 0847    PT Stop Time 0928    PT Time Calculation (min) 41 min    Activity Tolerance Patient tolerated treatment well    Behavior During Therapy [facility] for tasks assessed/performed           Past Medical History:  Diagnosis Date  . Anxiety   . Depression     Past Surgical History:  Procedure Laterality Date  . CEREBRAL MICROVASCULAR DECOMPRESSION    . CESAREAN SECTION    . FOOT SURGERY      There were no vitals filed for this visit.    Subjective Assessment - 09/07/20 0850    Subjective Pt reports that she had head surgery for microvascular decompression for trigeminal neuralgia 09/2018; after surgery she developed headaches of L temporal region. Describes headaches as pressure. Pt reports that pain from headache spreads to neck and shoulders. Pt reports neck and shoulder pain is pretty constant. Pt reports she was prescribed gabapentin is very helpful along with nsaids. Pt denies N/T in hands and denies radiating pain into UE. Has noticed that stress will increase pain. Also having trouble with L shoulder pain; history of frozen shoulder LUE and reports pain with reaching overhead and lifting grandson.    Pertinent History trigeminal neuralgia    Limitations House hold activities    Diagnostic tests none    Patient Stated Goals reduce pain/soreness; be able to manage pain without medicine    Currently in Pain? Yes    Pain Score 4     Pain Location Neck   and shoulders   Pain Orientation Left;Right;Mid    Pain Descriptors / Indicators Aching    Pain  Type Chronic pain    Pain Onset More than a month ago    Pain Frequency Constant    Aggravating Factors  picking up grandson, stress    Pain Relieving Factors gabapentin, nsaids              [facility] PT Assessment - 09/07/20 0001      Assessment   Medical Diagnosis Neck Pain with HA; L shoulder pain    Referring Provider (PT) Ashley Royalty    Onset Date/Surgical Date --   09/2018   Hand Dominance Right    Next MD Visit --   2-3 mo follow up   Prior Therapy PT for frozen shoulder      Precautions   Precautions None      Restrictions   Weight Bearing Restrictions No      Balance Screen   Has the patient fallen in the past 6 months No    Has the patient had a decrease in activity level because of a fear of falling?  No    Is the patient reluctant to leave their home because of a fear of falling?  No      Prior Function   Level of Independence Independent    Vocation Requirements taking care of boyfriend and mother    Leisure working in yard, walking, playing with grandson  Sensation   Light Touch Appears Intact      Posture/Postural Control   Posture/Postural Control Postural limitations    Postural Limitations Rounded Shoulders;Forward head    Posture Comments cues to reduce shoulder elevation      ROM / Strength   AROM / PROM / Strength AROM;Strength      AROM   Overall AROM Comments shoulder AROM full B; slow/guarded with L shoulder (prior frozen shoulder) and painful at end ranges LUE    AROM Assessment Site Cervical    Cervical Flexion 45    Cervical Extension 45    Cervical - Right Side Bend 25    Cervical - Left Side Bend 25    Cervical - Right Rotation 60    Cervical - Left Rotation 45      Strength   Overall Strength Comments BUE strength 5/5; mild pain with resisted L shoulder MMT      Palpation   Palpation comment tender to palpation B UT/cervical paraspinals/periscapulars/suboccipitals      Special Tests    Special Tests Rotator Cuff Impingement     Rotator Cuff Impingment tests Neer impingement test;Empty Can test;Speed's test      Neer Impingement test    Findings Positive    Side Left      Empty Can test   Findings Positive    Side Left      Speed's test   Findings Negative    Side Left                      Objective measurements completed on examination: See above findings.               PT Education - 09/07/20 1216    Education Details Pt educated on POC and HEP    Person(s) Educated Patient    Methods Explanation;Demonstration;Handout    Comprehension Verbalized understanding;Returned demonstration            PT Short Term Goals - 09/07/20 1226      PT SHORT TERM GOAL #1   Title Pt will be I with initial HEP    Time 2    Period Weeks    Status New    Target Date 09/21/20             PT Long Term Goals - 09/07/20 1227      PT LONG TERM GOAL #1   Title Pt will demo cervical AROM WFL with no increase in cervical pain    Time 6    Period Weeks    Status New    Target Date 10/19/20      PT LONG TERM GOAL #2   Title Pt will report able to pick up grandson without increase in L shoulder pain    Time 6    Period Weeks    Status New    Target Date 10/19/20      PT LONG TERM GOAL #3   Title Pt will report 50% reduction in frequency of HA    Time 6    Period Weeks    Status New    Target Date 10/19/20      PT LONG TERM GOAL #4   Title Pt will report 50% reduction in cervial pain/shoulder pain    Time 6    Period Weeks    Status New    Target Date 10/19/20  Plan - 09/07/20 1219    Clinical Impression Statement Pt presents to clinic with reports of B neck pain with HA present since decompression surgery for trigeminal neuralgia in 2020. Pt localizes HA to L temporal region extending into L suboccipitals/UT. Pt also reports under signfiicant stress and has been having B shoulder/neck pain. Has hx of L frozen shoulder; reports recent injury to L  shoulder when picking up grandson. Impingement testing positive on LUE along with mild pain with MMT and through ROM (has full L shoulder ROM). Cervical AROM is limited and painful, esp cervical rotation. Pt demos tender to palpation B UT, cervical paraspinals, periscapulars, and suboccipitals. Pt would benefit from skilled PT to address the above impairments.    Examination-Participation Restrictions Community Activity;Interpersonal Relationship    Stability/Clinical Decision Making Stable/Uncomplicated    Clinical Decision Making Low    Rehab Potential Good    PT Frequency 1x / week   pt expressed financial difficulties with copay; may transition to every other week with updated HEP as indicated   PT Duration 6 weeks    PT Treatment/Interventions ADLs/Self Care Home Management;Electrical Stimulation;Iontophoresis 4mg /ml Dexamethasone;Moist Heat;Neuromuscular re-education;Therapeutic exercise;Therapeutic activities;Patient/family education;Manual techniques;Dry needling;Passive range of motion;Taping    PT Next Visit Plan review and progress HEP (pt limited in freq d/t financial constraints), scap stab/postural ex's, cervical flexibility, manual/modalities as indicated    PT Home Exercise Plan see pt instructions    Consulted and Agree with Plan of Care Patient           Patient will benefit from skilled therapeutic intervention in order to improve the following deficits and impairments:  Decreased range of motion,Increased muscle spasms,Impaired UE functional use,Pain,Hypomobility,Postural dysfunction  Visit Diagnosis: Neck pain  Acute pain of left shoulder  Cramp and spasm  Muscle weakness (generalized)     Problem List Patient Active Problem List   Diagnosis Date Noted  . Well adult exam 07/06/2020  . Recurrent major depression in partial remission (HCC) 05/21/2020  . Hair loss 12/20/2019  . Chronic tension headache 06/06/2019  . Rotator cuff tendinitis, right 06/06/2019  .  Depression with anxiety 03/29/2019  . Trochanteric bursitis, right hip 03/14/2019  . Left-sided trigeminal neuralgia 03/24/2018  . Insomnia 10/31/2012  . Nicotine addiction 10/31/2012  . Osteoarthritis of hands 10/31/2012  . History of hepatitis C 10/31/2012  . Tremor 10/31/2012  . Generalized anxiety disorder 10/31/2012   11/02/2012, PT, DPT Lysle Rubens Tyrek Lawhorn 09/07/2020, 12:29 PM  [facility] [place] [address] [place], [place], [place] Phone: [phone]   Fax:  [phone]  Name: Caitlyn Hill MRN: Lelon Huh Date of Birth: 1955-09-14

## 2020-09-13 DIAGNOSIS — H524 Presbyopia: Secondary | ICD-10-CM | POA: Diagnosis not present

## 2020-09-13 DIAGNOSIS — Z0100 Encounter for examination of eyes and vision without abnormal findings: Secondary | ICD-10-CM | POA: Diagnosis not present

## 2020-09-14 ENCOUNTER — Ambulatory Visit: Payer: Medicare HMO | Admitting: Physical Therapy

## 2020-09-14 ENCOUNTER — Other Ambulatory Visit: Payer: Self-pay

## 2020-09-14 ENCOUNTER — Encounter: Payer: Self-pay | Admitting: Physical Therapy

## 2020-09-14 DIAGNOSIS — R252 Cramp and spasm: Secondary | ICD-10-CM | POA: Diagnosis not present

## 2020-09-14 DIAGNOSIS — M25512 Pain in left shoulder: Secondary | ICD-10-CM | POA: Diagnosis not present

## 2020-09-14 DIAGNOSIS — M542 Cervicalgia: Secondary | ICD-10-CM | POA: Diagnosis not present

## 2020-09-14 DIAGNOSIS — M6281 Muscle weakness (generalized): Secondary | ICD-10-CM | POA: Diagnosis not present

## 2020-09-14 NOTE — Therapy (Signed)
[facility] [place] [address] [place], [place], [address] Phone: [phone]   Fax:  [phone]  Physical Therapy Treatment  Patient Details  Name: Caitlyn Hill MRN: [id number] Date of Birth: Oct 23, 1955 Referring Provider (PT): Ashley Royalty   Encounter Date: 09/14/2020   PT End of Session - 09/14/20 0926    Visit Number 2    Date for PT Re-Evaluation 11/05/20    PT Start Time 0846    PT Stop Time 0926    PT Time Calculation (min) 40 min    Activity Tolerance Patient tolerated treatment well    Behavior During Therapy [facility] for tasks assessed/performed           Past Medical History:  Diagnosis Date  . Anxiety   . Depression     Past Surgical History:  Procedure Laterality Date  . CEREBRAL MICROVASCULAR DECOMPRESSION    . CESAREAN SECTION    . FOOT SURGERY      There were no vitals filed for this visit.   Subjective Assessment - 09/14/20 0845    Subjective Better than last week, "Muscles has not been as tight, I still feel it"    Currently in Pain? No/denies    Pain Location Shoulder    Pain Orientation Left;Right    Pain Descriptors / Indicators Sore                             [facility] Adult PT Treatment/Exercise - 09/14/20 0001      Exercises   Exercises Shoulder      Shoulder Exercises: Standing   Flexion Weights;20 reps;Strengthening;Both    Shoulder Flexion Weight (lbs) 1    ABduction Weights;20 reps;Strengthening;Both    Shoulder ABduction Weight (lbs) 1      Shoulder Exercises: ROM/Strengthening   UBE (Upper Arm Bike) L1 x 1 min each way      Manual Therapy   Manual Therapy Soft tissue mobilization;Passive ROM    Soft tissue mobilization posterior cervical para spinales    Passive ROM Cervical spin all directions                    PT Short Term Goals - 09/14/20 0933      PT SHORT TERM GOAL #1   Title Pt will be I with initial HEP    Status Achieved              PT Long Term Goals - 09/07/20 1227      PT LONG TERM GOAL #1   Title Pt will demo cervical AROM WFL with no increase in cervical pain    Time 6    Period Weeks    Status New    Target Date 10/19/20      PT LONG TERM GOAL #2   Title Pt will report able to pick up grandson without increase in L shoulder pain    Time 6    Period Weeks    Status New    Target Date 10/19/20      PT LONG TERM GOAL #3   Title Pt will report 50% reduction in frequency of HA    Time 6    Period Weeks    Status New    Target Date 10/19/20      PT LONG TERM GOAL #4   Title Pt will report 50% reduction in cervial pain/shoulder pain    Time 6  Period Weeks    Status New    Target Date 10/19/20                 Plan - 09/14/20 9470    Clinical Impression Statement Pt tolerated an initial progression to TE well. She did reports some anterior shoulder tightness with UBE warm up. This tightness was reported again with shoulder external rotation. Some tightness noted it the upper traps that improved with MT.    Examination-Participation Restrictions Community Activity;Interpersonal Relationship    Stability/Clinical Decision Making Stable/Uncomplicated    Rehab Potential Good    PT Frequency 1x / week    PT Duration 6 weeks    PT Treatment/Interventions ADLs/Self Care Home Management;Electrical Stimulation;Iontophoresis 4mg /ml Dexamethasone;Moist Heat;Neuromuscular re-education;Therapeutic exercise;Therapeutic activities;Patient/family education;Manual techniques;Dry needling;Passive range of motion;Taping    PT Next Visit Plan (pt limited in freq d/t financial constraints), scap stab/postural ex's, cervical flexibility, manual/modalities as indicated    PT Home Exercise Plan Gave pt red band and handout for Rows and shoulder extntions.           Patient will benefit from skilled therapeutic intervention in order to improve the following deficits and impairments:  Decreased range of  motion,Increased muscle spasms,Impaired UE functional use,Pain,Hypomobility,Postural dysfunction  Visit Diagnosis: Cramp and spasm  Acute pain of left shoulder  Neck pain     Problem List Patient Active Problem List   Diagnosis Date Noted  . Well adult exam 07/06/2020  . Recurrent major depression in partial remission (HCC) 05/21/2020  . Hair loss 12/20/2019  . Chronic tension headache 06/06/2019  . Rotator cuff tendinitis, right 06/06/2019  . Depression with anxiety 03/29/2019  . Trochanteric bursitis, right hip 03/14/2019  . Left-sided trigeminal neuralgia 03/24/2018  . Insomnia 10/31/2012  . Nicotine addiction 10/31/2012  . Osteoarthritis of hands 10/31/2012  . History of hepatitis C 10/31/2012  . Tremor 10/31/2012  . Generalized anxiety disorder 10/31/2012    11/02/2012, PTA 09/14/2020, 9:33 AM  [facility] [address] [place], [place], [place] Phone: [phone]   Fax:  [phone]  Name: Caitlyn Hill MRN: Lelon Huh Date of Birth: [birth year]

## 2020-09-21 ENCOUNTER — Ambulatory Visit: Payer: Medicare HMO | Admitting: Physical Therapy

## 2020-09-21 ENCOUNTER — Other Ambulatory Visit: Payer: Self-pay

## 2020-09-21 ENCOUNTER — Encounter: Payer: Self-pay | Admitting: Physical Therapy

## 2020-09-21 DIAGNOSIS — M542 Cervicalgia: Secondary | ICD-10-CM | POA: Diagnosis not present

## 2020-09-21 DIAGNOSIS — M25512 Pain in left shoulder: Secondary | ICD-10-CM | POA: Diagnosis not present

## 2020-09-21 DIAGNOSIS — M6281 Muscle weakness (generalized): Secondary | ICD-10-CM

## 2020-09-21 DIAGNOSIS — R252 Cramp and spasm: Secondary | ICD-10-CM

## 2020-09-21 NOTE — Therapy (Signed)
[facility] [address] [place], [place], [address] Phone: [phone]   Fax:  [phone]  Physical Therapy Treatment  Patient Details  Name: Caitlyn Hill MRN: [id number] Date of Birth: [birth year] Referring Provider (PT): Ashley Royalty   Encounter Date: 09/21/2020   PT End of Session - 09/21/20 0925    Visit Number 3    Date for PT Re-Evaluation 11/05/20    PT Start Time 0845    PT Stop Time 0925    PT Time Calculation (min) 40 min    Activity Tolerance Patient tolerated treatment well    Behavior During Therapy [facility] for tasks assessed/performed           Past Medical History:  Diagnosis Date  . Anxiety   . Depression     Past Surgical History:  Procedure Laterality Date  . CEREBRAL MICROVASCULAR DECOMPRESSION    . CESAREAN SECTION    . FOOT SURGERY      There were no vitals filed for this visit.   Subjective Assessment - 09/21/20 0849    Subjective "Feeling better, less pain, been doing the exercises"    Currently in Pain? No/denies                             [facility] Adult PT Treatment/Exercise - 09/21/20 0001      Shoulder Exercises: Standing   Flexion Weights;20 reps;Strengthening;Both    Shoulder Flexion Weight (lbs) 1    ABduction Weights;20 reps;Strengthening;Both    Shoulder ABduction Weight (lbs) 1    Extension Weights;Strengthening;Both;20 reps    Extension Weight (lbs) 5    Other Standing Exercises Tricep Ext 15lb 2x10      Shoulder Exercises: ROM/Strengthening   UBE (Upper Arm Bike) L1 x  each way    Other ROM/Strengthening Exercises Rows & Lats 15lb 2x10      Manual Therapy   Manual Therapy Soft tissue mobilization;Passive ROM    Soft tissue mobilization posterior cervical para spinales    Passive ROM Cervical spin all directions                    PT Short Term Goals - 09/21/20 7939      PT SHORT TERM GOAL #1   Title Pt will be I with initial HEP     Status Achieved             PT Long Term Goals - 09/21/20 0300      PT LONG TERM GOAL #1   Title Pt will demo cervical AROM WFL with no increase in cervical pain    Status On-going      PT LONG TERM GOAL #2   Title Pt will report able to pick up grandson without increase in L shoulder pain    Status On-going                 Plan - 09/21/20 0925    Clinical Impression Statement Improvement reported with less pain overall. She did well with a progressed treatment. Some L shoulder pain reported with lat pull downs but tolerable. Elbow pain reported with shoulder ext but again tolerable. No elbow pain with triceps ext. Some initial difficulty with shoulder abduction but this lessened as reps progressed. Positive response to MT with increase tissue elasticity.    Examination-Participation Restrictions Community Activity;Interpersonal Relationship    Stability/Clinical Decision Making Stable/Uncomplicated    Rehab Potential  Good    PT Frequency 1x / week    PT Duration 6 weeks    PT Treatment/Interventions ADLs/Self Care Home Management;Electrical Stimulation;Iontophoresis 4mg /ml Dexamethasone;Moist Heat;Neuromuscular re-education;Therapeutic exercise;Therapeutic activities;Patient/family education;Manual techniques;Dry needling;Passive range of motion;Taping    PT Next Visit Plan (pt limited in freq d/t financial constraints), scap stab/postural ex's, cervical flexibility, manual/modalities as indicated           Patient will benefit from skilled therapeutic intervention in order to improve the following deficits and impairments:  Decreased range of motion,Increased muscle spasms,Impaired UE functional use,Pain,Postural dysfunction  Visit Diagnosis: Cramp and spasm  Acute pain of left shoulder  Neck pain  Muscle weakness (generalized)     Problem List Patient Active Problem List   Diagnosis Date Noted  . Well adult exam 07/06/2020  . 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 Hair loss 12/20/2019  . Chronic tension headache 06/06/2019  . Rotator cuff tendinitis, right 06/06/2019  . Depression with anxiety 03/29/2019  . Trochanteric bursitis, right hip 03/14/2019  . Left-sided trigeminal neuralgia 03/24/2018  . Insomnia 10/31/2012  . Nicotine addiction 10/31/2012  . Osteoarthritis of hands 10/31/2012  . History of hepatitis C 10/31/2012  . Tremor 10/31/2012  . Generalized anxiety disorder 10/31/2012    11/02/2012, PTA 09/21/2020, 9:28 AM  [facility] [address] [place], [place], [place] Phone: [phone]   Fax:  [phone]  Name: Caitlyn Hill MRN: Lelon Huh Date of Birth: [birth year]

## 2020-09-26 ENCOUNTER — Other Ambulatory Visit: Payer: Self-pay

## 2020-09-26 ENCOUNTER — Ambulatory Visit (AMBULATORY_SURGERY_CENTER): Payer: Self-pay | Admitting: *Deleted

## 2020-09-26 VITALS — Ht 66.5 in | Wt 136.0 lb

## 2020-09-26 DIAGNOSIS — Z1211 Encounter for screening for malignant neoplasm of colon: Secondary | ICD-10-CM

## 2020-09-26 DIAGNOSIS — Z01818 Encounter for other preprocedural examination: Secondary | ICD-10-CM

## 2020-09-26 NOTE — Progress Notes (Signed)
Patient is here in-person for PV. Patient denies any allergies to eggs or soy. Patient denies any problems with anesthesia/sedation. Patient denies any oxygen use at home. Patient denies taking any diet/weight loss medications or blood thinners. Patient is not being treated for MRSA or C-diff. Patient is aware of our care-partner policy and Covid-19 safety protocol. EMMI education assigned to the patient for the procedure, sent to MyChart.   COVID-19 test 3/4 11 am  Patient is aware.

## 2020-09-28 ENCOUNTER — Encounter: Payer: Self-pay | Admitting: Physical Therapy

## 2020-09-28 ENCOUNTER — Ambulatory Visit: Payer: Medicare HMO | Admitting: Physical Therapy

## 2020-09-28 ENCOUNTER — Other Ambulatory Visit: Payer: Self-pay

## 2020-09-28 DIAGNOSIS — M25512 Pain in left shoulder: Secondary | ICD-10-CM | POA: Diagnosis not present

## 2020-09-28 DIAGNOSIS — M6281 Muscle weakness (generalized): Secondary | ICD-10-CM | POA: Diagnosis not present

## 2020-09-28 DIAGNOSIS — R252 Cramp and spasm: Secondary | ICD-10-CM | POA: Diagnosis not present

## 2020-09-28 DIAGNOSIS — M542 Cervicalgia: Secondary | ICD-10-CM | POA: Diagnosis not present

## 2020-09-28 NOTE — Therapy (Signed)
[name], [place], [address] Phone: [phone]   Fax:  [phone]  Physical Therapy Treatment  Patient Details  Name: Caitlyn Hill MRN: [id number] Date of Birth: 12/20/1955 Referring Provider (PT): Zigmund Daniel   Encounter Date: 09/28/2020   PT End of Session - 09/28/20 0925    Visit Number 4    Date for PT Re-Evaluation 11/05/20    PT Start Time 0845    PT Stop Time 0926    PT Time Calculation (min) 41 min    Activity Tolerance Patient tolerated treatment well    Behavior During Therapy [name] for tasks assessed/performed           Past Medical History:  Diagnosis Date  . Anxiety   . Depression   . Headache   . Hyperlipidemia     Past Surgical History:  Procedure Laterality Date  . CEREBRAL MICROVASCULAR DECOMPRESSION    . CESAREAN SECTION    . COLONOSCOPY  06/05/2009   [name]  . FOOT SURGERY    . TRIGEMINAL NERVE DECOMPRESSION     x2    There were no vitals filed for this visit.   Subjective Assessment - 09/28/20 0848    Subjective Pt reports that she is ok    Currently in Pain? Yes    Pain Score 2     Pain Location Shoulder    Pain Orientation Left;Right              [facility] PT Assessment - 09/28/20 0001      AROM   Overall AROM  Within functional limits for tasks performed                         [facility] Adult PT Treatment/Exercise - 09/28/20 0001      Shoulder Exercises: Standing   Horizontal ABduction Strengthening;Both;20 reps;Theraband    External Rotation Theraband;20 reps;Both;Strengthening    Theraband Level (Shoulder External Rotation) Level 2 (Red)    Flexion Weights;20 reps;Strengthening;Both    Shoulder Flexion Weight (lbs) 2    ABduction Weights;20 reps;Strengthening;Both    Shoulder ABduction Weight (lbs) 2    Other Standing Exercises Tricep Ext 15lb 2x10    Other Standing Exercises Shrugs 6lb 2x10      Shoulder Exercises: ROM/Strengthening    UBE (Upper Arm Bike) L1 x  97min each way    Other ROM/Strengthening Exercises Rows & Lats 15lb 2x10      Manual Therapy   Manual Therapy Soft tissue mobilization;Passive ROM    Soft tissue mobilization posterior cervical para spinales    Passive ROM Cervical spin all directions                    PT Short Term Goals - 09/21/20 8366      PT SHORT TERM GOAL #1   Title Pt will be I with initial HEP    Status Achieved             PT Long Term Goals - 09/28/20 0854      PT LONG TERM GOAL #1   Title Pt will demo cervical AROM WFL with no increase in cervical pain    Status Achieved      PT LONG TERM GOAL #2   Title Pt will report able to pick up grandson without increase in L shoulder pain    Status On-going      PT LONG TERM GOAL #  3   Title Pt will report 50% reduction in frequency of HA    Status Partially Met      PT LONG TERM GOAL #4   Title Pt will report 50% reduction in cervial pain/shoulder pain    Status Partially Met   some days are wirst than others, depends on stress levels                Plan - 09/28/20 0926    Clinical Impression Statement Pt has progressed increasing her cervical AROM meeting goal. She reports less pain with ADL and less headaches. She reports that her symptoms does not stop her from doing anything.She still reports pain with picking up her grandson. Tactile cues for posture given throughout session. Some weakness present with lat pull downs. Pt has financial concerns about co-pay and expressed that today was her last.    Examination-Participation Restrictions Community Activity;Interpersonal Relationship    Stability/Clinical Decision Making Stable/Uncomplicated    Rehab Potential Good    PT Frequency 1x / week    PT Duration 6 weeks    PT Treatment/Interventions ADLs/Self Care Home Management;Electrical Stimulation;Iontophoresis 4mg /ml Dexamethasone;Moist Heat;Neuromuscular re-education;Therapeutic exercise;Therapeutic  activities;Patient/family education;Manual techniques;Dry needling;Passive range of motion;Taping    PT Next Visit Plan D/C           Patient will benefit from skilled therapeutic intervention in order to improve the following deficits and impairments:  Decreased range of motion,Increased muscle spasms,Impaired UE functional use,Pain,Postural dysfunction  Visit Diagnosis: Muscle weakness (generalized)  Neck pain  Acute pain of left shoulder  Cramp and spasm     Problem List Patient Active Problem List   Diagnosis Date Noted  . Well adult exam 07/06/2020  . Recurrent major depression in partial remission ([place]) 05/21/2020  . Hair loss 12/20/2019  . Chronic tension headache 06/06/2019  . Rotator cuff tendinitis, right 06/06/2019  . Depression with anxiety 03/29/2019  . Trochanteric bursitis, right hip 03/14/2019  . Left-sided trigeminal neuralgia 03/24/2018  . Insomnia 10/31/2012  . Nicotine addiction 10/31/2012  . Osteoarthritis of hands 10/31/2012  . History of hepatitis C 10/31/2012  . Tremor 10/31/2012  . Generalized anxiety disorder 10/31/2012   PHYSICAL THERAPY DISCHARGE SUMMARY  Visits from Start of Care: 4   Plan: Patient agrees to discharge.  Patient goals were partially met. Patient is being discharged due to financial reasons.  ?????      [name], PT, DPT [name] 09/28/2020, 9:29 AM  [name], [place], [address] Phone: [phone]   Fax:  [phone]  Name: Caitlyn Hill MRN: [id number] Date of Birth: 10-Jan-1956

## 2020-10-01 ENCOUNTER — Encounter: Payer: Self-pay | Admitting: Internal Medicine

## 2020-10-08 DIAGNOSIS — Z1159 Encounter for screening for other viral diseases: Secondary | ICD-10-CM | POA: Diagnosis not present

## 2020-10-10 ENCOUNTER — Ambulatory Visit (AMBULATORY_SURGERY_CENTER): Payer: Medicare HMO | Admitting: Internal Medicine

## 2020-10-10 ENCOUNTER — Encounter: Payer: Self-pay | Admitting: Internal Medicine

## 2020-10-10 ENCOUNTER — Other Ambulatory Visit: Payer: Self-pay

## 2020-10-10 VITALS — BP 135/63 | HR 53 | Temp 97.7°F | Resp 22 | Ht 66.5 in | Wt 136.0 lb

## 2020-10-10 DIAGNOSIS — Z1211 Encounter for screening for malignant neoplasm of colon: Secondary | ICD-10-CM

## 2020-10-10 MED ORDER — SODIUM CHLORIDE 0.9 % IV SOLN: 500.0000 mL | Freq: Once | INTRAVENOUS | Status: DC

## 2020-10-10 NOTE — Progress Notes (Signed)
Vs by [facility].  previsit with RM.  No changes to health hx

## 2020-10-10 NOTE — Progress Notes (Signed)
To PACU, VSS. Report to [email or web address] 

## 2020-10-10 NOTE — Patient Instructions (Signed)
YOU HAD AN ENDOSCOPIC PROCEDURE TODAY AT THE [facility]:   Refer to the procedure report that was given to you for any specific questions about what was found during the examination.  If the procedure report does not answer your questions, please call your gastroenterologist to clarify.  If you requested that your care partner not be given the details of your procedure findings, then the procedure report has been included in a sealed envelope for you to review at your convenience later. ° °YOU SHOULD EXPECT: Some feelings of bloating in the abdomen. Passage of more gas than usual.  Walking can help get rid of the air that was put into your GI tract during the procedure and reduce the bloating. If you had a lower endoscopy (such as a colonoscopy or flexible sigmoidoscopy) you may notice spotting of blood in your stool or on the toilet paper. If you underwent a bowel prep for your procedure, you may not have a normal bowel movement for a few days. ° °Please Note:  You might notice some irritation and congestion in your nose or some drainage.  This is from the oxygen used during your procedure.  There is no need for concern and it should clear up in a day or so. ° °SYMPTOMS TO REPORT IMMEDIATELY: ° °Following lower endoscopy (colonoscopy or flexible sigmoidoscopy): ° Excessive amounts of blood in the stool ° Significant tenderness or worsening of abdominal pains ° Swelling of the abdomen that is new, acute ° Fever of 100°F or higher ° °For urgent or emergent issues, a gastroenterologist can be reached at any hour by calling [phone]. °Do not use MyChart messaging for urgent concerns.  ° ° °DIET:  We do recommend a small meal at first, but then you may proceed to your regular diet.  Drink plenty of fluids but you should avoid alcoholic beverages for 24 hours. ° °ACTIVITY:  You should plan to take it easy for the rest of today and you should NOT DRIVE or use heavy machinery until tomorrow (because of  the sedation medicines used during the test).   ° °FOLLOW UP: °Our staff will call the number listed on your records 48-72 hours following your procedure to check on you and address any questions or concerns that you may have regarding the information given to you following your procedure. If we do not reach you, we will leave a message.  We will attempt to reach you two times.  During this call, we will ask if you have developed any symptoms of COVID 19. If you develop any symptoms (ie: fever, flu-like symptoms, shortness of breath, cough etc.) before then, please call [phone].  If you test positive for Covid 19 in the 2 weeks post procedure, please call and report this information to us.   ° °SIGNATURES/CONFIDENTIALITY: °You and/or your care partner have signed paperwork which will be entered into your electronic medical record.  These signatures attest to the fact that that the information above on your After Visit Summary has been reviewed and is understood.  Full responsibility of the confidentiality of this discharge information lies with you and/or your care-partner.  °

## 2020-10-10 NOTE — Op Note (Signed)
[facility] Patient Name: Caitlyn Hill Procedure Date: 10/10/2020 10:38 AM MRN: [id number] Endoscopist: Iva Boop , MD Age: 65 Referring MD:  Date of Birth: Apr 20, 1956 Gender: Female Account #: [id number] Procedure:                Colonoscopy Indications:              Screening for colorectal malignant neoplasm, Last                            colonoscopy: 2010 Medicines:                Propofol per Anesthesia, Monitored Anesthesia Care Procedure:                Pre-Anesthesia Assessment:                           - Prior to the procedure, a History and Physical                            was performed, and patient medications and                            allergies were reviewed. The patient's tolerance of                            previous anesthesia was also reviewed. The risks                            and benefits of the procedure and the sedation                            options and risks were discussed with the patient.                            All questions were answered, and informed consent                            was obtained. Prior Anticoagulants: The patient has                            taken no previous anticoagulant or antiplatelet                            agents. ASA Grade Assessment: II - A patient with                            mild systemic disease. After reviewing the risks                            and benefits, the patient was deemed in                            satisfactory condition to undergo the procedure.  After obtaining informed consent, the colonoscope                            was passed under direct vision. Throughout the                            procedure, the patient's blood pressure, pulse, and                            oxygen saturations were monitored continuously. The                            Olympus PCF-H190DL [id number]) Colonoscope was                            introduced through the  anus and advanced to the the                            cecum, identified by appendiceal orifice and                            ileocecal valve. The colonoscopy was performed                            without difficulty. The patient tolerated the                            procedure well. The quality of the bowel                            preparation was good. The ileocecal valve,                            appendiceal orifice, and rectum were photographed.                            The bowel preparation used was Miralax via split                            dose instruction. Scope In: 10:48:08 AM Scope Out: 11:03:10 AM Scope Withdrawal Time: 0 hours 11 minutes 23 seconds  Total Procedure Duration: 0 hours 15 minutes 2 seconds  Findings:                 The perianal and digital rectal examinations were                            normal.                           Multiple diverticula were found in the sigmoid                            colon.  The exam was otherwise without abnormality on                            direct and retroflexion views. Complications:            No immediate complications. Estimated Blood Loss:     Estimated blood loss: none. Impression:               - Diverticulosis in the sigmoid colon.                           - The examination was otherwise normal on direct                            and retroflexion views.                           - No specimens collected. Recommendation:           - Patient has a contact number available for                            emergencies. The signs and symptoms of potential                            delayed complications were discussed with the                            patient. Return to normal activities tomorrow.                            Written discharge instructions were provided to the                            patient.                           - Resume previous diet.                            - Continue present medications.                           - No recommendation at this time regarding repeat                            colonoscopy due to age. Iva Boop, MD 10/10/2020 11:10:42 AM This report has been signed electronically.

## 2020-10-12 ENCOUNTER — Telehealth: Payer: Self-pay

## 2020-10-12 NOTE — Telephone Encounter (Signed)
  Follow up Call-  Call back number 10/10/2020  Post procedure Call Back phone  # [phone]  Permission to leave phone message No  Some recent data might be hidden     Patient questions:  Do you have a fever, pain , or abdominal swelling? No. Pain Score  0 *  Have you tolerated food without any problems? Yes.    Have you been able to return to your normal activities? Yes.    Do you have any questions about your discharge instructions: Diet   No. Medications  No. Follow up visit  No.  Do you have questions or concerns about your Care? Yes.    Actions: * If pain score is 4 or above: No action needed, pain <4.

## 2020-12-10 ENCOUNTER — Other Ambulatory Visit: Payer: Self-pay | Admitting: Family Medicine

## 2020-12-13 DIAGNOSIS — F419 Anxiety disorder, unspecified: Secondary | ICD-10-CM | POA: Diagnosis not present

## 2020-12-13 DIAGNOSIS — R69 Illness, unspecified: Secondary | ICD-10-CM | POA: Diagnosis not present

## 2020-12-13 DIAGNOSIS — G629 Polyneuropathy, unspecified: Secondary | ICD-10-CM | POA: Diagnosis not present

## 2020-12-13 DIAGNOSIS — M1990 Unspecified osteoarthritis, unspecified site: Secondary | ICD-10-CM | POA: Diagnosis not present

## 2020-12-13 DIAGNOSIS — Z720 Tobacco use: Secondary | ICD-10-CM | POA: Diagnosis not present

## 2020-12-13 DIAGNOSIS — G4700 Insomnia, unspecified: Secondary | ICD-10-CM | POA: Diagnosis not present

## 2020-12-13 DIAGNOSIS — R030 Elevated blood-pressure reading, without diagnosis of hypertension: Secondary | ICD-10-CM | POA: Diagnosis not present

## 2020-12-13 DIAGNOSIS — E785 Hyperlipidemia, unspecified: Secondary | ICD-10-CM | POA: Diagnosis not present

## 2020-12-13 DIAGNOSIS — Z8249 Family history of ischemic heart disease and other diseases of the circulatory system: Secondary | ICD-10-CM | POA: Diagnosis not present

## 2020-12-14 ENCOUNTER — Encounter: Payer: Self-pay | Admitting: Family Medicine

## 2020-12-14 ENCOUNTER — Telehealth (INDEPENDENT_AMBULATORY_CARE_PROVIDER_SITE_OTHER): Payer: Medicare HMO | Admitting: Family Medicine

## 2020-12-14 DIAGNOSIS — R69 Illness, unspecified: Secondary | ICD-10-CM | POA: Diagnosis not present

## 2020-12-14 DIAGNOSIS — F418 Other specified anxiety disorders: Secondary | ICD-10-CM | POA: Diagnosis not present

## 2020-12-14 MED ORDER — HYDROXYZINE PAMOATE 25 MG PO CAPS: 25.0000 mg | ORAL_CAPSULE | Freq: Three times a day (TID) | ORAL | 0 refills | Status: DC | PRN
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
## 2020-12-14 NOTE — Telephone Encounter (Signed)
I have scheduled appt for today with patient and Hyman Hopes

## 2020-12-25 ENCOUNTER — Ambulatory Visit: Payer: Medicare HMO | Admitting: Family Medicine

## 2020-12-26 ENCOUNTER — Ambulatory Visit: Payer: Medicare HMO | Admitting: Family Medicine

## 2020-12-28 ENCOUNTER — Other Ambulatory Visit: Payer: Self-pay | Admitting: Family Medicine

## 2020-12-28 DIAGNOSIS — F418 Other specified anxiety disorders: Secondary | ICD-10-CM

## 2021-01-10 ENCOUNTER — Ambulatory Visit (INDEPENDENT_AMBULATORY_CARE_PROVIDER_SITE_OTHER): Payer: Medicare HMO | Admitting: Family Medicine

## 2021-01-10 ENCOUNTER — Encounter: Payer: Self-pay | Admitting: Family Medicine

## 2021-01-10 ENCOUNTER — Other Ambulatory Visit: Payer: Self-pay

## 2021-01-10 DIAGNOSIS — G250 Essential tremor: Secondary | ICD-10-CM | POA: Diagnosis not present

## 2021-01-10 DIAGNOSIS — F418 Other specified anxiety disorders: Secondary | ICD-10-CM

## 2021-01-10 DIAGNOSIS — R69 Illness, unspecified: Secondary | ICD-10-CM | POA: Diagnosis not present

## 2021-01-10 DIAGNOSIS — G44221 Chronic tension-type headache, intractable: Secondary | ICD-10-CM | POA: Diagnosis not present

## 2021-01-10 MED ORDER — PROPRANOLOL HCL ER 60 MG PO CP24: 60.0000 mg | ORAL_CAPSULE | Freq: Every day | ORAL | 1 refills | Status: DC

## 2021-01-10 MED ORDER — FLUOXETINE HCL 20 MG PO CAPS: 40.0000 mg | ORAL_CAPSULE | Freq: Every day | ORAL | 3 refills | Status: DC

## 2021-01-10 MED ORDER — CYCLOBENZAPRINE HCL 10 MG PO TABS: ORAL_TABLET | ORAL | 3 refills | Status: DC

## 2021-01-10 NOTE — Patient Instructions (Signed)
Lets try adding propranolol for tremor.  Continue current medications.  See me again in 3 months.

## 2021-01-10 NOTE — Assessment & Plan Note (Signed)
She will continue fluoxetine at 40mg  daily, can further increase to 60mg  if needed. Continues vistaril as needed and trazodone for associated insomnia.

## 2021-01-10 NOTE — Progress Notes (Signed)
Caitlyn Hill - 65 y.o. female MRN [id number]  Date of birth: Feb 05, 1956  Subjective Chief Complaint  Patient presents with   Anxiety    HPI Caitlyn Hill is a 65 y.o. female here today for follow up visit.   Reports some increased depression and anxiety.  She reports that her boyfriend was recently critically ill.  His home that he was renting was sold by the landlord and he is now living with her.  He doesn't seem the same since moving in and has been more irritable.  She has increased her fluoxetine to 40mg  daily.  She also is vistaril as needed.  She continues on trazodone for sleep.   She has also noted increased tremor.  This is worse when trying to complete tasks such as writing, picking up a mug/glass, applying make up.  It does worsen some when more anxious as well.   Headaches have increased some in frequency.  She is taking tylenol more often to help with this.  She needs refill on flexeril.    ROS:  A comprehensive ROS was completed and negative except as noted per HPI  Allergies  Allergen Reactions   Penicillins Hives    Past Medical History:  Diagnosis Date   Anxiety    Depression    Headache    Hyperlipidemia     Past Surgical History:  Procedure Laterality Date   CEREBRAL MICROVASCULAR DECOMPRESSION     CESAREAN SECTION     COLONOSCOPY  06/05/2009   13/09/2008   FOOT SURGERY     TRIGEMINAL NERVE DECOMPRESSION     x2    Social History   Socioeconomic History   Marital status: Divorced    Spouse name: Not on file   Number of children: Not on file   Years of education: Not on file   Highest education level: Not on file  Occupational History   Not on file  Tobacco Use   Smoking status: Former    Pack years: 0.00   Smokeless tobacco: Never  Vaping Use   Vaping Use: Never used  Substance and Sexual Activity   Alcohol use: Yes    Alcohol/week: 4.0 standard drinks    Types: 4 Standard drinks or equivalent per week   Drug use: No   Sexual activity:  Yes    Birth control/protection: None  Other Topics Concern   Not on file  Social History Narrative   Not on file   Social Determinants of Health   Financial Resource Strain: Not on file  Food Insecurity: Not on file  Transportation Needs: Not on file  Physical Activity: Not on file  Stress: Not on file  Social Connections: Not on file    Family History  Problem Relation Age of Onset   Cancer Father        lung   Colon cancer Neg Hx    Esophageal cancer Neg Hx    Stomach cancer Neg Hx    Rectal cancer Neg Hx    Colon polyps Neg Hx     Health Maintenance  Topic Date Due   COVID-19 Vaccine (1) Never done   MAMMOGRAM  Never done   Zoster Vaccines- Shingrix (1 of 2) Never done   DEXA SCAN  Never done   TETANUS/TDAP  07/06/2021 (Originally 08/21/1972)   PNA vac Low Risk Adult (1 of 2 - PCV13) 07/06/2021 (Originally 08/21/2018)   INFLUENZA VACCINE  03/04/2021   COLONOSCOPY (Pts 45-72yrs Insurance coverage will need to  be confirmed)  10/11/2030   Hepatitis C Screening  Completed   HPV VACCINES  Aged Out     ----------------------------------------------------------------------------------------------------------------------------------------------------------------------------------------------------------------- Physical Exam BP (!) 159/81 (BP Location: Left Arm, Patient Position: Sitting, Cuff Size: Small)   Pulse 69   Ht 5' 6.5" (1.689 m)   Wt 136 lb (61.7 kg)   SpO2 98%   BMI 21.62 kg/m   Physical Exam Constitutional:      Appearance: Normal appearance.  HENT:     Head: Normocephalic and atraumatic.  Eyes:     General: No scleral icterus. Cardiovascular:     Rate and Rhythm: Normal rate and regular rhythm.  Pulmonary:     Effort: Pulmonary effort is normal.     Breath sounds: Normal breath sounds.  Musculoskeletal:        General: Normal range of motion.  Neurological:     General: No focal deficit present.     Mental Status: She is alert.      Comments: Tremor with movement notable with finger to nose testing.     ------------------------------------------------------------------------------------------------------------------------------------------------------------------------------------------------------------------- Assessment and Plan  Depression with anxiety She will continue fluoxetine at 40mg  daily, can further increase to 60mg  if needed. Continues vistaril as needed and trazodone for associated insomnia.   Chronic tension headache Discussed limiting tylenol intake. Flexeril renewed. Adding propranolol as well which may help with some the migrainous features she has.    Essential tremor Satrting propranolol 60mg  daily Return in about 3 months (around 04/12/2021) for Anxiety/Tremor.   Meds ordered this encounter  Medications   cyclobenzaprine (FLEXERIL) 10 MG tablet    Sig: Take once during the day as needed for spasm.    Dispense:  30 tablet    Refill:  3   FLUoxetine (PROZAC) 20 MG capsule    Sig: Take 2 capsules (40 mg total) by mouth daily.    Dispense:  180 capsule    Refill:  3   propranolol ER (INDERAL LA) 60 MG 24 hr capsule    Sig: Take 1 capsule (60 mg total) by mouth daily.    Dispense:  90 capsule    Refill:  1    Return in about 3 months (around 04/12/2021) for Anxiety/Tremor.    This visit occurred during the SARS-CoV-2 public health emergency.  Safety protocols were in place, including screening questions prior to the visit, additional usage of staff PPE, and extensive cleaning of exam room while observing appropriate contact time as indicated for disinfecting solutions.

## 2021-01-10 NOTE — Assessment & Plan Note (Signed)
Satrting propranolol 60mg  daily Return in about 3 months (around 04/12/2021) for Anxiety/Tremor.

## 2021-01-10 NOTE — Assessment & Plan Note (Signed)
Discussed limiting tylenol intake. Flexeril renewed. Adding propranolol as well which may help with some the migrainous features she has.

## 2021-01-18 ENCOUNTER — Encounter: Payer: Self-pay | Admitting: Family Medicine

## 2021-01-18 DIAGNOSIS — F418 Other specified anxiety disorders: Secondary | ICD-10-CM

## 2021-01-18 MED ORDER — HYDROXYZINE PAMOATE 25 MG PO CAPS: 25.0000 mg | ORAL_CAPSULE | Freq: Three times a day (TID) | ORAL | 0 refills | Status: DC | PRN

## 2021-02-08 ENCOUNTER — Other Ambulatory Visit: Payer: Self-pay | Admitting: Family Medicine

## 2021-02-08 DIAGNOSIS — F418 Other specified anxiety disorders: Secondary | ICD-10-CM

## 2021-02-08 DIAGNOSIS — M19242 Secondary osteoarthritis, left hand: Secondary | ICD-10-CM

## 2021-02-08 DIAGNOSIS — M19241 Secondary osteoarthritis, right hand: Secondary | ICD-10-CM

## 2021-03-11 ENCOUNTER — Other Ambulatory Visit: Payer: Self-pay | Admitting: Family Medicine

## 2021-03-31 ENCOUNTER — Encounter: Payer: Self-pay | Admitting: Family Medicine

## 2021-04-01 ENCOUNTER — Other Ambulatory Visit: Payer: Self-pay

## 2021-04-01 ENCOUNTER — Encounter: Payer: Self-pay | Admitting: Family Medicine

## 2021-04-01 ENCOUNTER — Ambulatory Visit (INDEPENDENT_AMBULATORY_CARE_PROVIDER_SITE_OTHER): Payer: Medicare HMO | Admitting: Family Medicine

## 2021-04-01 VITALS — BP 137/90 | HR 84 | Temp 98.1°F | Resp 17

## 2021-04-01 DIAGNOSIS — Z1211 Encounter for screening for malignant neoplasm of colon: Secondary | ICD-10-CM | POA: Diagnosis not present

## 2021-04-01 DIAGNOSIS — R5383 Other fatigue: Secondary | ICD-10-CM

## 2021-04-01 DIAGNOSIS — R197 Diarrhea, unspecified: Secondary | ICD-10-CM | POA: Diagnosis not present

## 2021-04-01 LAB — HEMOCCULT GUIAC POC 1CARD (OFFICE)
Card #1 Date: 8292022
Fecal Occult Blood, POC: NEGATIVE

## 2021-04-01 NOTE — Telephone Encounter (Signed)
Good to know. Thanks!

## 2021-04-01 NOTE — Patient Instructions (Signed)
Blood work today. If you have another episode of diarrhea, collect stool and use kit. Bring back to lab and we will let you know results.  Stay hydrated and gradually advance diet. Start with BRAT diet (bananas, rice, applesauce, toast) 

## 2021-04-01 NOTE — Progress Notes (Signed)
Acute Office Visit  Subjective:    Patient ID: Caitlyn Hill, female    DOB: November 19, 1955, 65 y.o.   MRN: [id number]  Chief Complaint  Patient presents with   Diarrhea    HPI Patient is in today for diarrhea, fatigue.   Patient states she drank a glass of milk with chocolate syrup in it Friday night and woke up during the night with diarrhea. States she had 4-5 episodes during the night, then another couple during the day before taking Pepto to make it stop. States that the diarrhea was black (prior to Gray) and it scared her. The next day she tried eating a bowl of cereal with milk and when she took one bite, she noticed the milk was bad - had a chemical taste to it. She has not had any more BMs since Saturday, but still feels "queasy." She denies any vomiting, abdominal pain, bright red blood in stool or urine, fevers. She did have a bad headache yesterday, but it has resolved today. Otherwise she just feels very tired and has a poor appetite - she is trying to stay hydrated and eat a bland diet.     Past Medical History:  Diagnosis Date   Anxiety    Depression    Headache    Hyperlipidemia     Past Surgical History:  Procedure Laterality Date   CEREBRAL MICROVASCULAR DECOMPRESSION     CESAREAN SECTION     COLONOSCOPY  06/05/2009   [name]   FOOT SURGERY     TRIGEMINAL NERVE DECOMPRESSION     x2    Family History  Problem Relation Age of Onset   Cancer Father        lung   Colon cancer Neg Hx    Esophageal cancer Neg Hx    Stomach cancer Neg Hx    Rectal cancer Neg Hx    Colon polyps Neg Hx     Social History   Socioeconomic History   Marital status: Divorced    Spouse name: Not on file   Number of children: Not on file   Years of education: Not on file   Highest education level: Not on file  Occupational History   Not on file  Tobacco Use   Smoking status: Former   Smokeless tobacco: Never  Vaping Use   Vaping Use: Never used  Substance and Sexual  Activity   Alcohol use: Yes    Alcohol/week: 4.0 standard drinks    Types: 4 Standard drinks or equivalent per week   Drug use: No   Sexual activity: Yes    Birth control/protection: None  Other Topics Concern   Not on file  Social History Narrative   Not on file   Social Determinants of Health   Financial Resource Strain: Not on file  Food Insecurity: Not on file  Transportation Needs: Not on file  Physical Activity: Not on file  Stress: Not on file  Social Connections: Not on file  Intimate Partner Violence: Not on file    Outpatient Medications Prior to Visit  Medication Sig Dispense Refill   atorvastatin (LIPITOR) 20 MG tablet Take 1 tablet (20 mg total) by mouth daily. 90 tablet 3   BIOTIN PO Take by mouth.     CALCIUM PO Take by mouth.     Cyanocobalamin (VITAMIN B-12 PO) Take by mouth.     cyclobenzaprine (FLEXERIL) 10 MG tablet Take once during the day as needed for spasm. 30 tablet 3  Ferrous Sulfate (IRON PO) Take by mouth. Every other day     FLUoxetine (PROZAC) 20 MG capsule Take 2 capsules (40 mg total) by mouth daily. 180 capsule 3   FLUoxetine (PROZAC) 20 MG tablet TAKE 2 TABLETS BY MOUTH EVERY DAY 180 tablet 0   gabapentin (NEURONTIN) 300 MG capsule TAKE 1 CAPSULE (300 MG TOTAL) BY MOUTH 3 (THREE) TIMES DAILY. START WITH 1 CAPSULE AT BEDTIME X1 WEEK THEN INCREASE TO 3 TIMES A DAY 90 capsule 3   hydrOXYzine (VISTARIL) 25 MG capsule TAKE 1 CAPSULE (25 MG TOTAL) BY MOUTH EVERY 8 (EIGHT) HOURS AS NEEDED. 30 capsule 0   meloxicam (MOBIC) 15 MG tablet TAKE 1 TABLET BY MOUTH EVERY DAY 90 tablet 1   Multiple Vitamin (MULTIVITAMIN ADULT PO) Take 1 tablet by mouth.     propranolol ER (INDERAL LA) 60 MG 24 hr capsule Take 1 capsule (60 mg total) by mouth daily. 90 capsule 1   tiZANidine (ZANAFLEX) 4 MG tablet TAKE 1 TABLET BY MOUTH EVERYDAY AT BEDTIME 90 tablet 1   traZODone (DESYREL) 50 MG tablet TAKE 1-2 TABLETS BY MOUTH AT BEDTIME AS NEEDED FOR SLEEP. 180 tablet 1    vitamin C (ASCORBIC ACID) 500 MG tablet Take 500 mg by mouth daily.     VITAMIN D PO Take by mouth.     VITAMIN E PO Take by mouth.     No facility-administered medications prior to visit.    Allergies  Allergen Reactions   Penicillins Hives    Review of Systems All review of systems negative except what is listed in the HPI     Objective:    Physical Exam Vitals reviewed.  Constitutional:      Appearance: Normal appearance.  HENT:     Head: Normocephalic and atraumatic.  Cardiovascular:     Rate and Rhythm: Normal rate and regular rhythm.     Pulses: Normal pulses.     Heart sounds: Normal heart sounds.  Pulmonary:     Effort: Pulmonary effort is normal.     Breath sounds: Normal breath sounds.  Abdominal:     General: Bowel sounds are normal. There is no distension.     Palpations: Abdomen is soft. There is no mass.     Tenderness: There is no abdominal tenderness. There is no guarding or rebound.     Hernia: No hernia is present.  Skin:    Capillary Refill: Capillary refill takes less than 2 seconds.     Findings: No rash.  Neurological:     Mental Status: She is alert and oriented to person, place, and time.  Psychiatric:        Mood and Affect: Mood normal.        Behavior: Behavior normal.        Thought Content: Thought content normal.        Judgment: Judgment normal.    BP 137/90   Pulse 84   Temp 98.1 F (36.7 C)   Resp 17   SpO2 95%  Wt Readings from Last 3 Encounters:  01/10/21 136 lb (61.7 kg)  10/10/20 136 lb (61.7 kg)  09/26/20 136 lb (61.7 kg)    Health Maintenance Due  Topic Date Due   COVID-19 Vaccine (1) Never done   MAMMOGRAM  Never done   Zoster Vaccines- Shingrix (1 of 2) Never done   DEXA SCAN  Never done   INFLUENZA VACCINE  03/04/2021    There are no preventive care reminders  to display for this patient.   Lab Results  Component Value Date   TSH 1.39 12/19/2019   Lab Results  Component Value Date   WBC 7.6  07/06/2020   HGB 14.9 07/06/2020   HCT 42.9 07/06/2020   MCV 91.5 07/06/2020   PLT 217 07/06/2020   Lab Results  Component Value Date   NA 139 07/06/2020   K 4.0 07/06/2020   CO2 28 07/06/2020   GLUCOSE 91 07/06/2020   BUN 10 07/06/2020   CREATININE 0.73 07/06/2020   BILITOT 0.6 07/06/2020   AST 23 07/06/2020   ALT 20 07/06/2020   PROT 7.0 07/06/2020   CALCIUM 10.4 07/06/2020   Lab Results  Component Value Date   CHOL 247 (H) 07/06/2020   Lab Results  Component Value Date   HDL 73 07/06/2020   Lab Results  Component Value Date   LDLCALC 145 (H) 07/06/2020   Lab Results  Component Value Date   TRIG 158 (H) 07/06/2020   Lab Results  Component Value Date   CHOLHDL 3.4 07/06/2020   No results found for: HGBA1C     Assessment & Plan:   1. Encounter for Hemoccult screening In-office occult stool negative.  - POCT occult blood stool  2. Diarrhea, unspecified type 3. Other fatigue Potential food poisoning or viral gastroenteritis. Patient is concerned about the black stool but negative occult card in office today. Will go ahead and check CBC to ensure Hgb is stable CMP to be sure she is not getting dehydrated. Will have lab send a stool kit home to collect the next episode of diarrhea to test. Also sending home another occult stool card to test with a better specimen when she has another BM. Patient encouraged to stay hydrated and keep with a bland (BRAT) diet and advance slowly as tired. It has been two days since last B, so likely towards the end of instance. She declined any medications for nausea at this time, states it is manageable at home. Patient aware of signs/symptoms requiring further/urgent evaluation.   - Salmonella/Shigella Cult, Campy EIA and Shiga Toxin reflex - CBC with Differential - COMPLETE METABOLIC PANEL WITH GFR   Follow-up if symptoms worsen or fail to improve.   Purcell Nails Olevia Bowens, DNP, FNP-C

## 2021-04-02 LAB — COMPLETE METABOLIC PANEL WITH GFR
AG Ratio: 2.2 (calc) (ref 1.0–2.5)
ALT: 18 U/L (ref 6–29)
AST: 18 U/L (ref 10–35)
Albumin: 4.7 g/dL (ref 3.6–5.1)
Alkaline phosphatase (APISO): 74 U/L (ref 37–153)
BUN: 9 mg/dL (ref 7–25)
CO2: 26 mmol/L (ref 20–32)
Calcium: 10.5 mg/dL — ABNORMAL HIGH (ref 8.6–10.4)
Chloride: 105 mmol/L (ref 98–110)
Creat: 0.68 mg/dL (ref 0.50–1.05)
Globulin: 2.1 g/dL (calc) (ref 1.9–3.7)
Glucose, Bld: 90 mg/dL (ref 65–99)
Potassium: 3.9 mmol/L (ref 3.5–5.3)
Sodium: 138 mmol/L (ref 135–146)
Total Bilirubin: 0.5 mg/dL (ref 0.2–1.2)
Total Protein: 6.8 g/dL (ref 6.1–8.1)
eGFR: 95 mL/min/{1.73_m2} (ref >=60)

## 2021-04-02 LAB — CBC WITH DIFFERENTIAL/PLATELET
Absolute Monocytes: 731 cells/uL (ref 200–950)
Basophils Absolute: 53 cells/uL (ref 0–200)
Basophils Relative: 0.5 %
Eosinophils Absolute: 117 cells/uL (ref 15–500)
Eosinophils Relative: 1.1 %
HCT: 46.4 % — ABNORMAL HIGH (ref 35.0–45.0)
Hemoglobin: 15.2 g/dL (ref 11.7–15.5)
Lymphs Abs: 3519 cells/uL (ref 850–3900)
MCH: 30.2 pg (ref 27.0–33.0)
MCHC: 32.8 g/dL (ref 32.0–36.0)
MCV: 92.2 fL (ref 80.0–100.0)
MPV: 10 fL (ref 7.5–12.5)
Monocytes Relative: 6.9 %
Neutro Abs: 6180 cells/uL (ref 1500–7800)
Neutrophils Relative %: 58.3 %
Platelets: 274 10*3/uL (ref 140–400)
RBC: 5.03 10*6/uL (ref 3.80–5.10)
RDW: 12.2 % (ref 11.0–15.0)
Total Lymphocyte: 33.2 %
WBC: 10.6 10*3/uL (ref 3.8–10.8)

## 2021-04-02 NOTE — Progress Notes (Signed)
MyChart message sent: Your labs look good! No signs of anemia.

## 2021-04-12 ENCOUNTER — Ambulatory Visit: Payer: Medicare HMO | Admitting: Family Medicine

## 2021-04-17 ENCOUNTER — Other Ambulatory Visit: Payer: Self-pay | Admitting: Family Medicine

## 2021-04-24 ENCOUNTER — Other Ambulatory Visit: Payer: Self-pay | Admitting: Family Medicine

## 2021-06-07 ENCOUNTER — Telehealth: Payer: Self-pay

## 2021-06-07 NOTE — Telephone Encounter (Signed)
Pt called stating elevated BP for a few days.  She is currently travelling and has no pharmacy location.   AM: 184/128 with headache 1hr: 174/12 NOON:  150/94 (taking 6.25 Losartan)  Pt states she took her boyfriends Losartan and is travelling with a few of the pills.  Please advise.

## 2021-06-07 NOTE — Telephone Encounter (Signed)
Needs come in to have [place] check her blood pressure in a standard fashion and we can determine if she needs a medication and which medication she needs, tough to tell with only the blood pressure no pulse rate listed.

## 2021-06-07 NOTE — Telephone Encounter (Signed)
Task completed. Patient has been informed of covering provider's recommendation. Patient is agreeable in scheduling an appointment for an evaluation. Patient informed to keep a record of her blood pressure/pulse readings until she is able to be seen at the clinic. Patient is aware to seek care at [facility], if symptoms persist or worsen. Patient will call the clinic when she returns from her trip.

## 2021-06-20 ENCOUNTER — Encounter: Payer: Self-pay | Admitting: Family Medicine

## 2021-06-20 ENCOUNTER — Ambulatory Visit (INDEPENDENT_AMBULATORY_CARE_PROVIDER_SITE_OTHER): Payer: Medicare HMO | Admitting: Family Medicine

## 2021-06-20 ENCOUNTER — Other Ambulatory Visit: Payer: Self-pay

## 2021-06-20 DIAGNOSIS — F418 Other specified anxiety disorders: Secondary | ICD-10-CM

## 2021-06-20 DIAGNOSIS — I10 Essential (primary) hypertension: Secondary | ICD-10-CM

## 2021-06-20 DIAGNOSIS — R69 Illness, unspecified: Secondary | ICD-10-CM | POA: Diagnosis not present

## 2021-06-20 MED ORDER — AMLODIPINE BESYLATE 5 MG PO TABS: 5.0000 mg | ORAL_TABLET | Freq: Every day | ORAL | 1 refills | Status: DC

## 2021-06-20 MED ORDER — ARIPIPRAZOLE 5 MG PO TABS: 5.0000 mg | ORAL_TABLET | Freq: Every day | ORAL | 1 refills | Status: DC

## 2021-06-20 NOTE — Progress Notes (Signed)
Caitlyn Hill - 65 y.o. female MRN [id number]  Date of birth: 1956-06-14  Subjective Chief Complaint  Patient presents with   Hypertension    HPI Caitlyn Hill is a 65 year old female here today for follow-up visit.  Blood pressure elevated today and has had elevated blood pressures at home.  She has had increased migraines recently.  Reports she has under increased stress.  She is no longer taking propranolol she did not feel that this was very effective for her migraines.  She had taken topiramate for headaches at one point but side effects were intolerable.  Reports increased stress with depressive symptoms.  She has taken fluoxetine and this has been fairly helpful until recently.  ROS:  A comprehensive ROS was completed and negative except as noted per HPI  Allergies  Allergen Reactions   Penicillins Hives    Past Medical History:  Diagnosis Date   Anxiety    Depression    Headache    Hyperlipidemia     Past Surgical History:  Procedure Laterality Date   CEREBRAL MICROVASCULAR DECOMPRESSION     CESAREAN SECTION     COLONOSCOPY  06/05/2009   [name]   FOOT SURGERY     TRIGEMINAL NERVE DECOMPRESSION     x2    Social History   Socioeconomic History   Marital status: Divorced    Spouse name: Not on file   Number of children: Not on file   Years of education: Not on file   Highest education level: Not on file  Occupational History   Not on file  Tobacco Use   Smoking status: Former   Smokeless tobacco: Never  Vaping Use   Vaping Use: Never used  Substance and Sexual Activity   Alcohol use: Yes    Alcohol/week: 4.0 standard drinks    Types: 4 Standard drinks or equivalent per week   Drug use: No   Sexual activity: Yes    Birth control/protection: None  Other Topics Concern   Not on file  Social History Narrative   Not on file   Social Determinants of Health   Financial Resource Strain: Not on file  Food Insecurity: Not on file  Transportation Needs: Not  on file  Physical Activity: Not on file  Stress: Not on file  Social Connections: Not on file    Family History  Problem Relation Age of Onset   Cancer Father        lung   Colon cancer Neg Hx    Esophageal cancer Neg Hx    Stomach cancer Neg Hx    Rectal cancer Neg Hx    Colon polyps Neg Hx     Health Maintenance  Topic Date Due   MAMMOGRAM  Never done   DEXA SCAN  Never done   TETANUS/TDAP  07/06/2021 (Originally 08/21/1972)   COVID-19 Vaccine (1) 07/06/2021 (Originally 02/18/1956)   Zoster Vaccines- Shingrix (1 of 2) 09/20/2021 (Originally 08/22/2003)   INFLUENZA VACCINE  11/01/2021 (Originally 03/04/2021)   Pneumonia Vaccine 2+ Years old (1 - PCV) 06/20/2022 (Originally 08[birth year]22/1959)   COLONOSCOPY (Pts 45-51yrs Insurance coverage will need to be confirmed)  10/11/2030   Hepatitis C Screening  Completed   HPV VACCINES  Aged Out     ----------------------------------------------------------------------------------------------------------------------------------------------------------------------------------------------------------------- Physical Exam BP (!) 177/101 (BP Location: Right Arm, Patient Position: Sitting, Cuff Size: Normal)   Pulse 71   Temp 98.3 F (36.8 C) (Oral)   Wt 138 lb 0.6 oz (62.6 kg)   SpO2 97%  BMI 21.95 kg/m   Physical Exam Constitutional:      Appearance: Normal appearance.  HENT:     Head: Normocephalic and atraumatic.  Eyes:     General: No scleral icterus. Cardiovascular:     Rate and Rhythm: Normal rate and regular rhythm.  Pulmonary:     Effort: Pulmonary effort is normal.     Breath sounds: Normal breath sounds.  Musculoskeletal:     Cervical back: Neck supple.  Neurological:     Mental Status: She is alert.  Psychiatric:        Mood and Affect: Mood normal.        Behavior: Behavior normal.     ------------------------------------------------------------------------------------------------------------------------------------------------------------------------------------------------------------------- Assessment and Plan  Depression with anxiety We discussed addition of medication to help with augmentation of her current medication of fluoxetine.  She does not want to add bupropion.  Adding aripiprazole 5 mg daily..  Essential hypertension Blood pressure is significantly elevated today.  Adding amlodipine 5 mg daily.  Follow-up in 4 weeks.   Meds ordered this encounter  Medications   ARIPiprazole (ABILIFY) 5 MG tablet    Sig: Take 1 tablet (5 mg total) by mouth daily.    Dispense:  90 tablet    Refill:  1   amLODipine (NORVASC) 5 MG tablet    Sig: Take 1 tablet (5 mg total) by mouth daily.    Dispense:  90 tablet    Refill:  1    Return in about 4 weeks (around 07/18/2021) for BP/Mood.    This visit occurred during the SARS-CoV-2 public health emergency.  Safety protocols were in place, including screening questions prior to the visit, additional usage of staff PPE, and extensive cleaning of exam room while observing appropriate contact time as indicated for disinfecting solutions.

## 2021-06-20 NOTE — Assessment & Plan Note (Signed)
Blood pressure is significantly elevated today.  Adding amlodipine 5 mg daily.  Follow-up in 4 weeks.

## 2021-06-20 NOTE — Assessment & Plan Note (Signed)
We discussed addition of medication to help with augmentation of her current medication of fluoxetine.  She does not want to add bupropion.  Adding aripiprazole 5 mg daily.Caitlyn Hill

## 2021-06-20 NOTE — Patient Instructions (Signed)
Add amlodipine 5mg  daily for blood pressure.  Add abilify (aripiprazole) 5mg  in the evening

## 2021-06-23 ENCOUNTER — Other Ambulatory Visit: Payer: Self-pay | Admitting: Family Medicine

## 2021-06-23 DIAGNOSIS — M19242 Secondary osteoarthritis, left hand: Secondary | ICD-10-CM

## 2021-06-23 DIAGNOSIS — M19241 Secondary osteoarthritis, right hand: Secondary | ICD-10-CM

## 2021-06-25 ENCOUNTER — Telehealth: Payer: Self-pay

## 2021-06-25 NOTE — Telephone Encounter (Signed)
Spoke to Caitlyn Hill. She will try [facility].

## 2021-06-25 NOTE — Telephone Encounter (Signed)
Pt lvm stating Abilify costs $300.00  Returned patient's call. No answer. VM full.   Patient should try to use Rx with [facility] at [facility] or [facility]. Cost for 90 days = $21.62 & 20.10

## 2021-07-23 ENCOUNTER — Ambulatory Visit: Payer: Medicare HMO | Admitting: Family Medicine

## 2021-09-17 ENCOUNTER — Telehealth: Payer: Self-pay

## 2021-09-17 NOTE — Telephone Encounter (Signed)
Please have her schedule VV with me at next available same day opening.  If having worsening symptoms recommend urgent care.

## 2021-09-17 NOTE — Telephone Encounter (Signed)
Symptoms started Monday. At-home test: last night  Current sx's: headache, nasal congestion, sore throat  Temp: 102.4 took meds and ice packs

## 2021-09-17 NOTE — Telephone Encounter (Signed)
Pt is scheduled for VV on 09/18/21

## 2021-09-18 ENCOUNTER — Encounter: Payer: Self-pay | Admitting: Family Medicine

## 2021-09-18 ENCOUNTER — Telehealth (INDEPENDENT_AMBULATORY_CARE_PROVIDER_SITE_OTHER): Payer: Medicare HMO | Admitting: Family Medicine

## 2021-09-18 DIAGNOSIS — U071 COVID-19: Secondary | ICD-10-CM | POA: Insufficient documentation

## 2021-09-18 MED ORDER — NIRMATRELVIR/RITONAVIR (PAXLOVID)TABLET: 3.0000 | ORAL_TABLET | Freq: Two times a day (BID) | ORAL | 0 refills | Status: AC

## 2021-09-18 NOTE — Assessment & Plan Note (Signed)
Adding course of Paxlovid.  Recommend continued symptomatic and supportive care with increased fluids and over-the-counter analgesics, decongestants and expectorant.  Instructed to hold atorvastatin while taking Paxlovid.  Seek emergency care if having significantly worsening respiratory symptoms.

## 2021-09-18 NOTE — Progress Notes (Signed)
Caitlyn Hill - 66 y.o. female MRN [id number]  Date of birth: [birth year]   This visit type was conducted due to national recommendations for restrictions regarding the COVID-19 Pandemic (e.g. social distancing).  This format is felt to be most appropriate for this patient at this time.  All issues noted in this document were discussed and addressed.  No physical exam was performed (except for noted visual exam findings with Video Visits).  I discussed the limitations of evaluation and management by telemedicine and the availability of in person appointments. The patient expressed understanding and agreed to proceed.  I connected withNAME@ on 09/18/21 at 11:30 AM EST by a video enabled telemedicine application and verified that I am speaking with the correct person using two identifiers.  Present at visit: Everrett Coombe, DO Lelon Huh   Patient Location: [facility] [address] [place] [address]   Provider location:   [facility]  Chief Complaint  Patient presents with   Covid Positive    HPI  Caitlyn Hill is a 66 y.o. female who presents via audio/video conferencing for a telehealth visit today.  Reports developing symptoms of COVID including fever, headache, fatigue, body aches and cough approximately 2 days ago.  Tested positive at home on Monday.  She denies chest pain, shortness of breath, nausea, vomiting or diarrhea.  Currently using DayQuil and NyQuil for symptom relief.   ROS:  A comprehensive ROS was completed and negative except as noted per HPI  Past Medical History:  Diagnosis Date   Anxiety    Depression    Headache    Hyperlipidemia     Past Surgical History:  Procedure Laterality Date   CEREBRAL MICROVASCULAR DECOMPRESSION     CESAREAN SECTION     COLONOSCOPY  06/05/2009   [name]   FOOT SURGERY     TRIGEMINAL NERVE DECOMPRESSION     x2    Family History  Problem Relation Age of Onset   Cancer Father        lung   Colon cancer Neg Hx    Esophageal  cancer Neg Hx    Stomach cancer Neg Hx    Rectal cancer Neg Hx    Colon polyps Neg Hx     Social History   Socioeconomic History   Marital status: Divorced    Spouse name: Not on file   Number of children: Not on file   Years of education: Not on file   Highest education level: Not on file  Occupational History   Not on file  Tobacco Use   Smoking status: Former   Smokeless tobacco: Never  Vaping Use   Vaping Use: Never used  Substance and Sexual Activity   Alcohol use: Yes    Alcohol/week: 4.0 standard drinks    Types: 4 Standard drinks or equivalent per week   Drug use: No   Sexual activity: Yes    Birth control/protection: None  Other Topics Concern   Not on file  Social History Narrative   Not on file   Social Determinants of Health   Financial Resource Strain: Not on file  Food Insecurity: Not on file  Transportation Needs: Not on file  Physical Activity: Not on file  Stress: Not on file  Social Connections: Not on file  Intimate Partner Violence: Not on file     Current Outpatient Medications:    amLODipine (NORVASC) 5 MG tablet, Take 1 tablet (5 mg total) by mouth daily., Disp: 90 tablet, Rfl: 1  ARIPiprazole (ABILIFY) 5 MG tablet, Take 1 tablet (5 mg total) by mouth daily., Disp: 90 tablet, Rfl: 1   atorvastatin (LIPITOR) 20 MG tablet, TAKE 1 TABLET BY MOUTH EVERY DAY, Disp: 90 tablet, Rfl: 3   BIOTIN PO, Take by mouth., Disp: , Rfl:    CALCIUM PO, Take by mouth., Disp: , Rfl:    Cyanocobalamin (VITAMIN B-12 PO), Take by mouth., Disp: , Rfl:    cyclobenzaprine (FLEXERIL) 10 MG tablet, Take once during the day as needed for spasm., Disp: 30 tablet, Rfl: 3   Ferrous Sulfate (IRON PO), Take by mouth. Every other day, Disp: , Rfl:    FLUoxetine (PROZAC) 20 MG capsule, Take 2 capsules (40 mg total) by mouth daily., Disp: 180 capsule, Rfl: 3   FLUoxetine (PROZAC) 20 MG tablet, Take 40 mg by mouth daily., Disp: , Rfl:    gabapentin (NEURONTIN) 300 MG capsule,  TAKE 1 CAPSULE (300 MG TOTAL) BY MOUTH 3 (THREE) TIMES DAILY. START WITH 1 CAPSULE AT BEDTIME X1 WEEK THEN INCREASE TO 3 TIMES A DAY, Disp: 90 capsule, Rfl: 3   hydrOXYzine (VISTARIL) 25 MG capsule, TAKE 1 CAPSULE (25 MG TOTAL) BY MOUTH EVERY 8 (EIGHT) HOURS AS NEEDED., Disp: 30 capsule, Rfl: 0   meloxicam (MOBIC) 15 MG tablet, TAKE 1 TABLET BY MOUTH EVERY DAY, Disp: 90 tablet, Rfl: 1   Multiple Vitamin (MULTIVITAMIN ADULT PO), Take 1 tablet by mouth., Disp: , Rfl:    nirmatrelvir/ritonavir EUA (PAXLOVID) 20 x 150 MG & 10 x 100MG  TABS, Take 3 tablets by mouth 2 (two) times daily for 5 days. (Take nirmatrelvir 150 mg two tablets twice daily for 5 days and ritonavir 100 mg one tablet twice daily for 5 days) Patient GFR is 95, Disp: 30 tablet, Rfl: 0   tiZANidine (ZANAFLEX) 4 MG tablet, TAKE 1 TABLET BY MOUTH EVERYDAY AT BEDTIME, Disp: 90 tablet, Rfl: 1   traZODone (DESYREL) 50 MG tablet, TAKE 1-2 TABLETS BY MOUTH AT BEDTIME AS NEEDED FOR SLEEP., Disp: 180 tablet, Rfl: 1   vitamin C (ASCORBIC ACID) 500 MG tablet, Take 500 mg by mouth daily., Disp: , Rfl:    VITAMIN D PO, Take by mouth., Disp: , Rfl:    VITAMIN E PO, Take by mouth., Disp: , Rfl:   EXAM:  VITALS per patient if applicable: Temp (!) 101.2 F (38.4 C)    Ht 5' 6.5" (1.689 m)    Wt 138 lb (62.6 kg)    BMI 21.94 kg/m   GENERAL: alert, oriented, appears well and in no acute distress  HEENT: atraumatic, conjunttiva clear, no obvious abnormalities on inspection of external nose and ears  NECK: normal movements of the head and neck  LUNGS: on inspection no signs of respiratory distress, breathing rate appears normal, no obvious gross SOB, gasping or wheezing  CV: no obvious cyanosis  MS: moves all visible extremities without noticeable abnormality  PSYCH/NEURO: pleasant and cooperative, no obvious depression or anxiety, speech and thought processing grossly intact  ASSESSMENT AND PLAN:  Discussed the following assessment and  plan:  COVID-19 Adding course of Paxlovid.  Recommend continued symptomatic and supportive care with increased fluids and over-the-counter analgesics, decongestants and expectorant.  Instructed to hold atorvastatin while taking Paxlovid.  Seek emergency care if having significantly worsening respiratory symptoms.     I discussed the assessment and treatment plan with the patient. The patient was provided an opportunity to ask questions and all were answered. The patient agreed with the plan and  demonstrated an understanding of the instructions.   The patient was advised to call back or seek an in-person evaluation if the symptoms worsen or if the condition fails to improve as anticipated.    Everrett Coombe, DO

## 2021-09-18 NOTE — Progress Notes (Signed)
Symptoms started Monday. At-home test yesterday = COVID +  Current Sx's: headache, body ache, diarrhea Tried: Tylenol, ibuprofen.

## 2021-09-20 ENCOUNTER — Other Ambulatory Visit: Payer: Self-pay | Admitting: Family Medicine

## 2021-09-25 ENCOUNTER — Other Ambulatory Visit: Payer: Self-pay | Admitting: Family Medicine

## 2021-11-26 ENCOUNTER — Other Ambulatory Visit: Payer: Self-pay | Admitting: Family Medicine

## 2021-12-13 ENCOUNTER — Other Ambulatory Visit: Payer: Self-pay | Admitting: Family Medicine

## 2021-12-13 NOTE — Telephone Encounter (Signed)
Patient is scheduled   

## 2021-12-13 NOTE — Telephone Encounter (Signed)
Please contact patient to schedule appt for HTN follow-up. Past due (4-week follow-up never scheduled). Sending 30 day refill until appointment date available. Thanks ?

## 2021-12-19 DIAGNOSIS — B0089 Other herpesviral infection: Secondary | ICD-10-CM | POA: Diagnosis not present

## 2021-12-31 ENCOUNTER — Ambulatory Visit: Payer: Medicare HMO | Admitting: Family Medicine

## 2022-01-03 ENCOUNTER — Ambulatory Visit (INDEPENDENT_AMBULATORY_CARE_PROVIDER_SITE_OTHER): Payer: Medicare HMO | Admitting: Family Medicine

## 2022-01-03 ENCOUNTER — Encounter: Payer: Self-pay | Admitting: Family Medicine

## 2022-01-03 VITALS — BP 118/70 | HR 60 | Ht 66.5 in | Wt 137.0 lb

## 2022-01-03 DIAGNOSIS — Z780 Asymptomatic menopausal state: Secondary | ICD-10-CM | POA: Diagnosis not present

## 2022-01-03 DIAGNOSIS — B029 Zoster without complications: Secondary | ICD-10-CM

## 2022-01-03 DIAGNOSIS — F3341 Major depressive disorder, recurrent, in partial remission: Secondary | ICD-10-CM

## 2022-01-03 DIAGNOSIS — M7061 Trochanteric bursitis, right hip: Secondary | ICD-10-CM

## 2022-01-03 DIAGNOSIS — M549 Dorsalgia, unspecified: Secondary | ICD-10-CM | POA: Diagnosis not present

## 2022-01-03 DIAGNOSIS — Z13820 Encounter for screening for osteoporosis: Secondary | ICD-10-CM | POA: Diagnosis not present

## 2022-01-03 DIAGNOSIS — Z1231 Encounter for screening mammogram for malignant neoplasm of breast: Secondary | ICD-10-CM

## 2022-01-03 DIAGNOSIS — R69 Illness, unspecified: Secondary | ICD-10-CM | POA: Diagnosis not present

## 2022-01-03 DIAGNOSIS — F5101 Primary insomnia: Secondary | ICD-10-CM

## 2022-01-03 DIAGNOSIS — I10 Essential (primary) hypertension: Secondary | ICD-10-CM | POA: Diagnosis not present

## 2022-01-03 MED ORDER — METHYLPREDNISOLONE ACETATE 40 MG/ML IJ SUSP
40.0000 mg | Freq: Once | INTRAMUSCULAR | Status: AC
  Administered 2022-01-03: 40 mg via INTRAMUSCULAR

## 2022-01-03 MED ORDER — LIDOCAINE 5 % EX PTCH: 1.0000 | MEDICATED_PATCH | CUTANEOUS | 0 refills | Status: DC

## 2022-01-03 NOTE — Patient Instructions (Addendum)
Try the lidoderm patches for pain.  Continue all other medications  Consider shingles vaccine at your pharmacy once this completely resolves.

## 2022-01-04 ENCOUNTER — Other Ambulatory Visit: Payer: Self-pay | Admitting: Family Medicine

## 2022-01-05 DIAGNOSIS — M549 Dorsalgia, unspecified: Secondary | ICD-10-CM | POA: Insufficient documentation

## 2022-01-05 DIAGNOSIS — B029 Zoster without complications: Secondary | ICD-10-CM | POA: Insufficient documentation

## 2022-01-05 NOTE — Assessment & Plan Note (Signed)
Blood pressure well controlled at this time.  Recommend continuation of amlodipine at current strength. 

## 2022-01-05 NOTE — Progress Notes (Signed)
Caitlyn Hill - 66 y.o. female MRN [id number]  Date of birth: Jan 25, 1956  Subjective Chief Complaint  Patient presents with   Herpes Zoster    HPI Caitlyn Hill is a 66 year old female here today for follow-up visit.  Recently has had a bout with shingles over the past couple weeks.  She was seen at [facility] dermatology and started on Valtrex.  These are clearing up but she continues have significant pain.  She has tried gabapentin in the past for other pain and reports that she did not tolerate this well.  She would not like to try this again for shingles.   Otherwise she feels well.  Blood pressures well controlled with amlodipine.  She continues to tolerate atorvastatin well for management of her lipids.  She is having some increased back pain and would like to have an injection of Depo-Medrol.  This is worked quite well for her in the past.  She does have meloxicam that she uses as needed at home.  She was at her mental state has improved quite a bit.  Her boyfriend has moved out which is actually helped her mental state.  She does continue to see him on the weekends.  She continues on fluoxetine daily with trazodone at night.  ROS:  A comprehensive ROS was completed and negative except as noted per HPI      Allergies  Allergen Reactions   Penicillins Hives    Past Medical History:  Diagnosis Date   Anxiety    Depression    Headache    Hyperlipidemia     Past Surgical History:  Procedure Laterality Date   CEREBRAL MICROVASCULAR DECOMPRESSION     CESAREAN SECTION     COLONOSCOPY  06/05/2009   Caitlyn Hill   FOOT SURGERY     TRIGEMINAL NERVE DECOMPRESSION     x2    Social History   Socioeconomic History   Marital status: Divorced    Spouse name: Not on file   Number of children: Not on file   Years of education: Not on file   Highest education level: Not on file  Occupational History   Not on file  Tobacco Use   Smoking status: Former   Smokeless tobacco: Never   Vaping Use   Vaping Use: Never used  Substance and Sexual Activity   Alcohol use: Yes    Alcohol/week: 4.0 standard drinks    Types: 4 Standard drinks or equivalent per week   Drug use: No   Sexual activity: Yes    Birth control/protection: None  Other Topics Concern   Not on file  Social History Narrative   Not on file   Social Determinants of Health   Financial Resource Strain: Not on file  Food Insecurity: Not on file  Transportation Needs: Not on file  Physical Activity: Not on file  Stress: Not on file  Social Connections: Not on file    Family History  Problem Relation Age of Onset   Cancer Father        lung   Colon cancer Neg Hx    Esophageal cancer Neg Hx    Stomach cancer Neg Hx    Rectal cancer Neg Hx    Colon polyps Neg Hx     Health Maintenance  Topic Date Due   MAMMOGRAM  Never done   DEXA SCAN  Never done   COVID-19 Vaccine (3 - Booster for Moderna series) 01/19/2022 (Originally 10/24/2019)   Zoster Vaccines- Shingrix (1 of 2) 04/05/2022 (  Originally 08/22/2003)   Pneumonia Vaccine 68+ Years old (1 - PCV) 06/20/2022 (Originally 08/21/2018)   TETANUS/TDAP  01/04/2023 (Originally 08/21/1972)   INFLUENZA VACCINE  03/04/2022   COLONOSCOPY (Pts 45-79yrs Insurance coverage will need to be confirmed)  10/11/2030   Hepatitis C Screening  Completed   HPV VACCINES  Aged Out     ----------------------------------------------------------------------------------------------------------------------------------------------------------------------------------------------------------------- Physical Exam BP 118/70 (BP Location: Right Arm, Patient Position: Sitting, Cuff Size: Small)   Pulse 60   Ht 5' 6.5" (1.689 m)   Wt 137 lb (62.1 kg)   SpO2 98%   BMI 21.78 kg/m   Physical Exam Constitutional:      Appearance: Normal appearance.  Cardiovascular:     Rate and Rhythm: Normal rate and regular rhythm.  Pulmonary:     Effort: Pulmonary effort is normal.      Breath sounds: Normal breath sounds.  Skin:    Comments: Rash along the left lower posterior hip area.  Areas have nearly healed except for 1 small area towards the front of the hip.  Neurological:     General: No focal deficit present.     Mental Status: She is alert.  Psychiatric:        Mood and Affect: Mood normal.        Behavior: Behavior normal.    ------------------------------------------------------------------------------------------------------------------------------------------------------------------------------------------------------------------- Assessment and Plan  Essential hypertension Blood pressure well controlled at this time.  Recommend continuation of amlodipine at current strength.  Recurrent major depression in partial remission (Caitlyn Hill) Her boyfriend moving out has helped some.  She will continue fluoxetine at current strength.  Continue trazodone as needed for insomnia.  Back pain Depo-Medrol injection given today.  Shingles She has continued to have pain.  Rash seems to be resolving.  We will try Lidoderm patches.   Meds ordered this encounter  Medications   lidocaine (LIDODERM) 5 %    Sig: Place 1 patch onto the skin daily. Remove & Discard patch within 12 hours or as directed by MD.  For shingles pain.  Do not apply to any open lesions/sores.    Dispense:  30 patch    Refill:  0   methylPREDNISolone acetate (DEPO-MEDROL) injection 40 mg    No follow-ups on file.    This visit occurred during the SARS-CoV-2 public health emergency.  Safety protocols were in place, including screening questions prior to the visit, additional usage of staff PPE, and extensive cleaning of exam room while observing appropriate contact time as indicated for disinfecting solutions.

## 2022-01-05 NOTE — Assessment & Plan Note (Signed)
She has continued to have pain.  Rash seems to be resolving.  We will try Lidoderm patches.

## 2022-01-05 NOTE — Assessment & Plan Note (Signed)
Depo-Medrol injection given today.

## 2022-01-05 NOTE — Assessment & Plan Note (Signed)
Her boyfriend moving out has helped some.  She will continue fluoxetine at current strength.  Continue trazodone as needed for insomnia.

## 2022-01-06 DIAGNOSIS — Z8249 Family history of ischemic heart disease and other diseases of the circulatory system: Secondary | ICD-10-CM | POA: Diagnosis not present

## 2022-01-06 DIAGNOSIS — M62838 Other muscle spasm: Secondary | ICD-10-CM | POA: Diagnosis not present

## 2022-01-06 DIAGNOSIS — R69 Illness, unspecified: Secondary | ICD-10-CM | POA: Diagnosis not present

## 2022-01-06 DIAGNOSIS — Z880 Allergy status to penicillin: Secondary | ICD-10-CM | POA: Diagnosis not present

## 2022-01-06 DIAGNOSIS — I10 Essential (primary) hypertension: Secondary | ICD-10-CM | POA: Diagnosis not present

## 2022-01-06 DIAGNOSIS — M1990 Unspecified osteoarthritis, unspecified site: Secondary | ICD-10-CM | POA: Diagnosis not present

## 2022-01-06 DIAGNOSIS — Z809 Family history of malignant neoplasm, unspecified: Secondary | ICD-10-CM | POA: Diagnosis not present

## 2022-01-06 DIAGNOSIS — E785 Hyperlipidemia, unspecified: Secondary | ICD-10-CM | POA: Diagnosis not present

## 2022-01-06 DIAGNOSIS — Z87891 Personal history of nicotine dependence: Secondary | ICD-10-CM | POA: Diagnosis not present

## 2022-01-06 DIAGNOSIS — Z008 Encounter for other general examination: Secondary | ICD-10-CM | POA: Diagnosis not present

## 2022-01-06 DIAGNOSIS — G4700 Insomnia, unspecified: Secondary | ICD-10-CM | POA: Diagnosis not present

## 2022-01-13 ENCOUNTER — Other Ambulatory Visit: Payer: Self-pay | Admitting: Family Medicine

## 2022-01-14 ENCOUNTER — Telehealth: Payer: Self-pay

## 2022-01-14 NOTE — Telephone Encounter (Addendum)
Initiated Prior authorization [id number] 5% patches Via: Covermymeds Case/Key: [id number] Status: denied as of 01/14/22 Reason:Is the requested drug being prescribed for any of the following: A) Pain associated with post-herpetic  neuralgia, B) Pain associated with diabetic neuropathy, C) Pain associated with cancer-related neuropathy  (including treatment-related neuropathy [e.g., neuropathy associated with radiation treatment or  chemotherapy])? Notified Pt via: Mychart

## 2022-01-22 ENCOUNTER — Ambulatory Visit (INDEPENDENT_AMBULATORY_CARE_PROVIDER_SITE_OTHER): Payer: Medicare HMO

## 2022-01-22 DIAGNOSIS — Z13820 Encounter for screening for osteoporosis: Secondary | ICD-10-CM

## 2022-01-22 DIAGNOSIS — Z780 Asymptomatic menopausal state: Secondary | ICD-10-CM | POA: Diagnosis not present

## 2022-01-22 DIAGNOSIS — Z1231 Encounter for screening mammogram for malignant neoplasm of breast: Secondary | ICD-10-CM | POA: Diagnosis not present

## 2022-01-22 DIAGNOSIS — M810 Age-related osteoporosis without current pathological fracture: Secondary | ICD-10-CM | POA: Diagnosis not present

## 2022-02-19 ENCOUNTER — Ambulatory Visit (INDEPENDENT_AMBULATORY_CARE_PROVIDER_SITE_OTHER): Payer: Medicare HMO | Admitting: Family Medicine

## 2022-02-19 DIAGNOSIS — Z0000 Encounter for general adult medical examination without abnormal findings: Secondary | ICD-10-CM

## 2022-02-19 NOTE — Patient Instructions (Addendum)
MEDICARE ANNUAL WELLNESS VISIT Health Maintenance Summary and Written Plan of Care  [name] ,  Thank you for allowing me to perform your Medicare Annual Wellness Visit and for your ongoing commitment to your health.   Health Maintenance & Immunization History Health Maintenance  Topic Date Due   COVID-19 Vaccine (3 - Moderna series) 03/07/2022 (Originally 10/24/2019)   Zoster Vaccines- Shingrix (1 of 2) 04/05/2022 (Originally 08/22/2003)   Pneumonia Vaccine 30+ Years old (1 - PCV) 06/20/2022 (Originally 08/21/2018)   TETANUS/TDAP  01/04/2023 (Originally 08/21/1972)   INFLUENZA VACCINE  03/04/2022   MAMMOGRAM  01/23/2024   DEXA SCAN  01/23/2024   COLONOSCOPY (Pts 45-51yrs Insurance coverage will need to be confirmed)  10/11/2030   Hepatitis C Screening  Completed   HPV VACCINES  Aged Out   Immunization History  Administered Date(s) Administered   [employer] Vaccination 08/02/2019, 08/29/2019    These are the patient goals that we discussed:  Goals Addressed               This Visit's Progress     Patient Stated (pt-stated)        02/19/2022 AWV Goal: Fall Prevention  Over the next year, patient will decrease their risk for falls by: Using assistive devices, such as a cane or walker, as needed Identifying fall risks within their home and correcting them by: Removing throw rugs Adding handrails to stairs or ramps Removing clutter and keeping a clear pathway throughout the home Increasing light, especially at night Adding shower handles/bars Raising toilet seat Identifying potential personal risk factors for falls: Medication side effects Incontinence/urgency Vestibular dysfunction Hearing loss Musculoskeletal disorders Neurological disorders Orthostatic hypotension           This is a list of Health Maintenance Items that are overdue or due now: Pneumococcal vaccine  Td vaccine Shingles vaccine  Patient declined the vaccines at this time.Patient  is considering the shingles vaccine and will let us know if she changes her mind.  Orders/Referrals Placed Today: No orders of the defined types were placed in this encounter.  (Contact our referral department at [phone] if you have not spoken with someone about your referral appointment within the next 5 days)    Follow-up Plan Follow-up with Everrett Coombe, DO as planned Medicare wellness visit in one year. AVS printed and mailed to the patient.      Health Maintenance, Female Adopting a healthy lifestyle and getting preventive care are important in promoting health and wellness. Ask your health care provider about: The right schedule for you to have regular tests and exams. Things you can do on your own to prevent diseases and keep yourself healthy. What should I know about diet, weight, and exercise? Eat a healthy diet  Eat a diet that includes plenty of vegetables, fruits, low-fat dairy products, and lean protein. Do not eat a lot of foods that are high in solid fats, added sugars, or sodium. Maintain a healthy weight Body mass index (BMI) is used to identify weight problems. It estimates body fat based on height and weight. Your health care provider can help determine your BMI and help you achieve or maintain a healthy weight. Get regular exercise Get regular exercise. This is one of the most important things you can do for your health. Most adults should: Exercise for at least 150 minutes each week. The exercise should increase your heart rate and make you sweat (moderate-intensity exercise). Do strengthening exercises at least twice a week. This is  in addition to the moderate-intensity exercise. Spend less time sitting. Even light physical activity can be beneficial. Watch cholesterol and blood lipids Have your blood tested for lipids and cholesterol at 68 years of age, then have this test every 5 years. Have your cholesterol levels checked more often if: Your lipid or  cholesterol levels are high. You are older than 68 years of age. You are at high risk for heart disease. What should I know about cancer screening? Depending on your health history and family history, you may need to have cancer screening at various ages. This may include screening for: Breast cancer. Cervical cancer. Colorectal cancer. Skin cancer. Lung cancer. What should I know about heart disease, diabetes, and high blood pressure? Blood pressure and heart disease High blood pressure causes heart disease and increases the risk of stroke. This is more likely to develop in people who have high blood pressure readings or are overweight. Have your blood pressure checked: Every 3-5 years if you are 71-86 years of age. Every year if you are 52 years old or older. Diabetes Have regular diabetes screenings. This checks your fasting blood sugar level. Have the screening done: Once every three years after age 53 if you are at a normal weight and have a low risk for diabetes. More often and at a younger age if you are overweight or have a high risk for diabetes. What should I know about preventing infection? Hepatitis B If you have a higher risk for hepatitis B, you should be screened for this virus. Talk with your health care provider to find out if you are at risk for hepatitis B infection. Hepatitis C Testing is recommended for: Everyone born from 18 through 1965. Anyone with known risk factors for hepatitis C. Sexually transmitted infections (STIs) Get screened for STIs, including gonorrhea and chlamydia, if: You are sexually active and are younger than 68 years of age. You are older than 68 years of age and your health care provider tells you that you are at risk for this type of infection. Your sexual activity has changed since you were last screened, and you are at increased risk for chlamydia or gonorrhea. Ask your health care provider if you are at risk. Ask your health care  provider about whether you are at high risk for HIV. Your health care provider may recommend a prescription medicine to help prevent HIV infection. If you choose to take medicine to prevent HIV, you should first get tested for HIV. You should then be tested every 3 months for as long as you are taking the medicine. Pregnancy If you are about to stop having your period (premenopausal) and you may become pregnant, seek counseling before you get pregnant. Take 400 to 800 micrograms (mcg) of folic acid every day if you become pregnant. Ask for birth control (contraception) if you want to prevent pregnancy. Osteoporosis and menopause Osteoporosis is a disease in which the bones lose minerals and strength with aging. This can result in bone fractures. If you are 72 years old or older, or if you are at risk for osteoporosis and fractures, ask your health care provider if you should: Be screened for bone loss. Take a calcium or vitamin D supplement to lower your risk of fractures. Be given hormone replacement therapy (HRT) to treat symptoms of menopause. Follow these instructions at home: Alcohol use Do not drink alcohol if: Your health care provider tells you not to drink. You are pregnant, may be pregnant, or are  planning to become pregnant. If you drink alcohol: Limit how much you have to: 0-1 drink a day. Know how much alcohol is in your drink. In the [place] one drink equals one 12 oz bottle of beer (355 mL), one 5 oz glass of wine (148 mL), or one 1 oz glass of hard liquor (44 mL). Lifestyle Do not use any products that contain nicotine or tobacco. These products include cigarettes, chewing tobacco, and vaping devices, such as e-cigarettes. If you need help quitting, ask your health care provider. Do not use street drugs. Do not share needles. Ask your health care provider for help if you need support or information about quitting drugs. General instructions Schedule regular health, dental, and  eye exams. Stay current with your vaccines. Tell your health care provider if: You often feel depressed. You have ever been abused or do not feel safe at home. Summary Adopting a healthy lifestyle and getting preventive care are important in promoting health and wellness. Follow your health care provider's instructions about healthy diet, exercising, and getting tested or screened for diseases. Follow your health care provider's instructions on monitoring your cholesterol and blood pressure. This information is not intended to replace advice given to you by your health care provider. Make sure you discuss any questions you have with your health care provider. Document Revised: 12/10/2020 Document Reviewed: 12/10/2020 Elsevier Patient Education  2023 [employer].

## 2022-02-19 NOTE — Progress Notes (Signed)
MEDICARE ANNUAL WELLNESS VISIT  02/19/2022  Telephone Visit Disclaimer This Medicare AWV was conducted by telephone due to national recommendations for restrictions regarding the COVID-19 Pandemic (e.g. social distancing).  I verified, using two identifiers, that I am speaking with Caitlyn Hill or their authorized healthcare agent. I discussed the limitations, risks, security, and privacy concerns of performing an evaluation and management service by telephone and the potential availability of an in-person appointment in the future. The patient expressed understanding and agreed to proceed.  Location of Patient: Home Location of Provider (nurse):  In the office.  Subjective:    Caitlyn Hill is a 66 y.o. female patient of Everrett Coombe, DO who had a Medicare Annual Wellness Visit today via telephone. Caitlyn Hill is Retired and lives alone. she has 2 children. she reports that she is socially active and does interact with friends/family regularly. she is moderately physically active and enjoys swimming when she has time.  Patient Care Team: Everrett Coombe, DO as PCP - General (Family Medicine)     02/19/2022    8:12 AM 10/10/2020   10:17 AM  Advanced Directives  Does Patient Have a Medical Advance Directive? No No  Would patient like information on creating a medical advance directive? No - Patient declined No - Patient declined    Hospital Utilization Over the Past 12 Months: # of hospitalizations or ER visits: 0 # of surgeries: 0  Review of Systems    Patient reports that her overall health is better compared to last year.  History obtained from chart review and the patient  Patient Reported Readings (BP, Pulse, CBG, Weight, etc) none  Pain Assessment Pain : No/denies pain     Current Medications & Allergies (verified) Allergies as of 02/19/2022       Reactions   Penicillins Hives        Medication List        Accurate as of February 19, 2022  8:32 AM. If you have any  questions, ask your nurse or doctor.          amLODipine 5 MG tablet Commonly known as: NORVASC TAKE 1 TABLET (5 MG TOTAL) BY MOUTH DAILY.   atorvastatin 20 MG tablet Commonly known as: LIPITOR TAKE 1 TABLET BY MOUTH EVERY DAY What changed:  how much to take how to take this when to take this additional instructions   BIOTIN PO Take by mouth.   CALCIUM PO Take by mouth.   cyclobenzaprine 10 MG tablet Commonly known as: FLEXERIL Take once during the day as needed for spasm.   FLUoxetine 20 MG capsule Commonly known as: PROZAC TAKE 2 CAPSULES BY MOUTH EVERY DAY   hydrOXYzine 25 MG capsule Commonly known as: VISTARIL TAKE 1 CAPSULE (25 MG TOTAL) BY MOUTH EVERY 8 (EIGHT) HOURS AS NEEDED.   IRON PO Take by mouth. Every other day   lidocaine 5 % Commonly known as: Lidoderm Place 1 patch onto the skin daily. Remove & Discard patch within 12 hours or as directed by MD.  For shingles pain.  Do not apply to any open lesions/sores.   meloxicam 15 MG tablet Commonly known as: MOBIC TAKE 1 TABLET BY MOUTH EVERY DAY   MULTIVITAMIN ADULT PO Take 1 tablet by mouth.   tiZANidine 4 MG tablet Commonly known as: ZANAFLEX TAKE 1 TABLET BY MOUTH EVERYDAY AT BEDTIME   traZODone 50 MG tablet Commonly known as: DESYREL TAKE 1 TO 2 TABLETS BY MOUTH AT BEDTIME AS NEEDED  FOR SLEEP   TURMERIC PO Take by mouth.   valACYclovir 1000 MG tablet Commonly known as: VALTREX Take 1,000 mg by mouth 3 (three) times daily.   VITAMIN B-12 PO Take by mouth.   vitamin C 500 MG tablet Commonly known as: ASCORBIC ACID Take 500 mg by mouth daily.   VITAMIN D PO Take by mouth.   VITAMIN E PO Take by mouth.        History (reviewed): Past Medical History:  Diagnosis Date   Allergy    Penicillin   Anxiety    Arthritis    Depression    Headache    Hyperlipidemia    Hypertension    Osteoporosis    Past Surgical History:  Procedure Laterality Date   BRAIN SURGERY      CEREBRAL MICROVASCULAR DECOMPRESSION     CESAREAN SECTION     COLONOSCOPY  06/05/2009   [name]   FOOT SURGERY     TRIGEMINAL NERVE DECOMPRESSION     x2   Family History  Problem Relation Age of Onset   Cancer Father        lung   Intellectual disability Father    Colon cancer Neg Hx    Esophageal cancer Neg Hx    Stomach cancer Neg Hx    Rectal cancer Neg Hx    Colon polyps Neg Hx    Social History   Socioeconomic History   Marital status: Divorced    Spouse name: Not on file   Number of children: 2   Years of education: 13   Highest education level: Some college, no degree  Occupational History   Occupation: retired  Tobacco Use   Smoking status: Former    Packs/day: 1.00    Years: 10.00    Total pack years: 10.00    Types: Cigarettes, E-cigarettes    Quit date: 09/04/2021    Years since quitting: 0.4   Smokeless tobacco: Never   Tobacco comments:    I quit  Vaping Use   Vaping Use: Never used  Substance and Sexual Activity   Alcohol use: Yes    Alcohol/week: 4.0 standard drinks of alcohol    Types: 4 Standard drinks or equivalent per week   Drug use: No   Sexual activity: Yes    Birth control/protection: Post-menopausal, None  Other Topics Concern   Not on file  Social History Narrative   Lives alone. She does watch her grandson a few days a week.    Social Determinants of Health   Financial Resource Strain: Medium Risk (02/18/2022)   Overall Financial Resource Strain (CARDIA)    Difficulty of Paying Living Expenses: Somewhat hard  Food Insecurity: No Food Insecurity (02/18/2022)   Hunger Vital Sign    Worried About Running Out of Food in the Last Year: Never true    Ran Out of Food in the Last Year: Never true  Transportation Needs: No Transportation Needs (02/18/2022)   PRAPARE - Administrator, Civil Service (Medical): No    Lack of Transportation (Non-Medical): No  Physical Activity: Sufficiently Active (02/18/2022)   Exercise Vital Sign     Days of Exercise per Week: 5 days    Minutes of Exercise per Session: 60 min  Stress: No Stress Concern Present (02/18/2022)   [employer] of Occupational Health - Occupational Stress Questionnaire    Feeling of Stress : Only a little  Social Connections: Socially Isolated (02/19/2022)   Social Connection and Isolation Panel [NHANES]  Frequency of Communication with Friends and Family: More than three times a week    Frequency of Social Gatherings with Friends and Family: Three times a week    Attends Religious Services: Never    Active Member of Clubs or Organizations: No    Attends Banker Meetings: Never    Marital Status: Divorced    Activities of Daily Living    02/18/2022    5:06 PM  In your present state of health, do you have any difficulty performing the following activities:  Hearing? 0  Vision? 0  Difficulty concentrating or making decisions? 0  Walking or climbing stairs? 0  Dressing or bathing? 0  Doing errands, shopping? 0  Preparing Food and eating ? N  Using the Toilet? N  In the past six months, have you accidently leaked urine? N  Do you have problems with loss of bowel control? N  Managing your Medications? N  Managing your Finances? N  Housekeeping or managing your Housekeeping? N    Patient Education/ Literacy How often do you need to have someone help you when you read instructions, pamphlets, or other written materials from your doctor or pharmacy?: 1 - Never What is the last grade level you completed in school?: 12th grade and some college  Exercise Current Exercise Habits: Home exercise routine, Type of exercise: walking, Time (Minutes): 60, Frequency (Times/Week): 5, Weekly Exercise (Minutes/Week): 300, Intensity: Mild, Exercise limited by: None identified  Diet Patient reports consuming 1 small meal a day and 1 snack(s) a day Patient reports that her primary diet is: Regular Patient reports that she does have regular access  to food.   Depression Screen    02/19/2022    8:08 AM 01/03/2022    3:47 PM 01/10/2021    2:05 PM 08/31/2020   11:23 AM 07/06/2020    1:21 PM 12/19/2019    2:25 PM 04/26/2019   10:49 AM  PHQ 2/9 Scores  PHQ - 2 Score 0 0 6 2 5 2 5   PHQ- 9 Score 0 0 12 2 7 4 17      Fall Risk    02/19/2022    8:08 AM 02/18/2022    5:06 PM 01/10/2021    1:38 PM 07/06/2020    1:21 PM  Fall Risk   Falls in the past year? 0 0 0 0  Number falls in past yr: 0 0 0 0  Injury with Fall? 0 0 0 0  Risk for fall due to : No Fall Risks  No Fall Risks No Fall Risks  Follow up Falls evaluation completed  Falls evaluation completed Falls evaluation completed     Objective:  03/12/2021 seemed alert and oriented and she participated appropriately during our telephone visit.  Blood Pressure Weight BMI  BP Readings from Last 3 Encounters:  01/03/22 118/70  06/20/21 (!) 177/101  04/01/21 137/90   Wt Readings from Last 3 Encounters:  01/03/22 137 lb (62.1 kg)  09/18/21 138 lb (62.6 kg)  06/20/21 138 lb 0.6 oz (62.6 kg)   BMI Readings from Last 1 Encounters:  01/03/22 21.78 kg/m    *Unable to obtain current vital signs, weight, and BMI due to telephone visit type  Hearing/Vision  Cherrie did not seem to have difficulty with hearing/understanding during the telephone conversation Reports that she has not had a formal eye exam by an eye care professional within the past year Reports that she has not had a formal hearing evaluation within the  past year *Unable to fully assess hearing and vision during telephone visit type  Cognitive Function:    02/19/2022    8:18 AM  6CIT Screen  What Year? 0 points  What month? 0 points  What time? 0 points  Count back from 20 0 points  Months in reverse 0 points  Repeat phrase 2 points  Total Score 2 points   (Normal:0-7, Significant for Dysfunction: >8)  Normal Cognitive Function Screening: Yes   Immunization & Health Maintenance Record Immunization History   Administered Date(s) Administered   Moderna Sars-Covid-2 Vaccination 08/02/2019, 08/29/2019    Health Maintenance  Topic Date Due   COVID-19 Vaccine (3 - Moderna series) 03/07/2022 (Originally 10/24/2019)   Zoster Vaccines- Shingrix (1 of 2) 04/05/2022 (Originally 08/22/2003)   Pneumonia Vaccine 22+ Years old (1 - PCV) 06/20/2022 (Originally 08/21/2018)   TETANUS/TDAP  01/04/2023 (Originally 08/21/1972)   INFLUENZA VACCINE  03/04/2022   MAMMOGRAM  01/23/2024   DEXA SCAN  01/23/2024   COLONOSCOPY (Pts 45-69yrs Insurance coverage will need to be confirmed)  10/11/2030   Hepatitis C Screening  Completed   HPV VACCINES  Aged Out       Assessment  This is a routine wellness examination for Caitlyn Hill.  Health Maintenance: Due or Overdue There are no preventive care reminders to display for this patient.   Caitlyn Hill does not need a referral for Community Assistance: Care Management:   no Social Work:    no Prescription Assistance:  no Nutrition/Diabetes Education:  no   Plan:  Personalized Goals  Goals Addressed               This Visit's Progress     Patient Stated (pt-stated)        02/19/2022 AWV Goal: Fall Prevention  Over the next year, patient will decrease their risk for falls by: Using assistive devices, such as a cane or walker, as needed Identifying fall risks within their home and correcting them by: Removing throw rugs Adding handrails to stairs or ramps Removing clutter and keeping a clear pathway throughout the home Increasing light, especially at night Adding shower handles/bars Raising toilet seat Identifying potential personal risk factors for falls: Medication side effects Incontinence/urgency Vestibular dysfunction Hearing loss Musculoskeletal disorders Neurological disorders Orthostatic hypotension         Personalized Health Maintenance & Screening Recommendations  Pneumococcal vaccine  Td vaccine Shingles vaccine  Patient  declined the vaccines at this time. Patient is considering the shingles vaccine and will let us know if she changes her mind.  Lung Cancer Screening Recommended: no (Low Dose CT Chest recommended if Age 80-80 years, 30 pack-year currently smoking OR have quit w/in past 15 years) Hepatitis C Screening recommended: no HIV Screening recommended: no  Advanced Directives: Written information was not prepared per patient's request.  Referrals & Orders No orders of the defined types were placed in this encounter.   Follow-up Plan Follow-up with Everrett Coombe, DO as planned Medicare wellness visit in one year. AVS printed and mailed to the patient.   I have personally reviewed and noted the following in the patient's chart:   Medical and social history Use of alcohol, tobacco or illicit drugs  Current medications and supplements Functional ability and status Nutritional status Physical activity Advanced directives List of other physicians Hospitalizations, surgeries, and ER visits in previous 12 months Vitals Screenings to include cognitive, depression, and falls Referrals and appointments  In addition, I have reviewed and discussed with  Caitlyn Hill certain preventive protocols, quality metrics, and best practice recommendations. A written personalized care plan for preventive services as well as general preventive health recommendations is available and can be mailed to the patient at her request.      [place] CharonBableen  Doniesha Landau, RN BSN  02/19/2022

## 2022-03-04 ENCOUNTER — Encounter: Payer: Self-pay | Admitting: Family Medicine

## 2022-03-04 ENCOUNTER — Ambulatory Visit (INDEPENDENT_AMBULATORY_CARE_PROVIDER_SITE_OTHER): Payer: Medicare HMO | Admitting: Family Medicine

## 2022-03-04 VITALS — BP 138/75 | HR 73 | Ht 66.5 in | Wt 136.0 lb

## 2022-03-04 DIAGNOSIS — R251 Tremor, unspecified: Secondary | ICD-10-CM | POA: Diagnosis not present

## 2022-03-04 DIAGNOSIS — G44221 Chronic tension-type headache, intractable: Secondary | ICD-10-CM | POA: Diagnosis not present

## 2022-03-04 DIAGNOSIS — R5383 Other fatigue: Secondary | ICD-10-CM

## 2022-03-04 DIAGNOSIS — M810 Age-related osteoporosis without current pathological fracture: Secondary | ICD-10-CM | POA: Insufficient documentation

## 2022-03-04 MED ORDER — PROPRANOLOL HCL ER 60 MG PO CP24: ORAL_CAPSULE | ORAL | 3 refills | Status: DC

## 2022-03-04 NOTE — Patient Instructions (Signed)
Start propranolol for headache prevention and tremor.  We'll be in touch with lab results.

## 2022-03-04 NOTE — Assessment & Plan Note (Signed)
We discussed options for treatment.  She prefers to take Fosamax.  Potential side effects reviewed.  Checking calcium and vitamin D levels today.  We will need to make sure these are optimal prior to starting.

## 2022-03-04 NOTE — Progress Notes (Signed)
Caitlyn Hill - 66 y.o. female MRN [id number]  Date of birth: 01-15-1956  Subjective No chief complaint on file.   HPI Caitlyn Hill is a 66 year old female here today to discuss results of bone density test.  Recent bone density test with T score -3.2.  No prior history of fracture.  She is interested in pursuing treatment of this.  She is not currently taking calcium or vitamin D supplement.  She continues to have problems with essential tremor.  She would like to try adding medication to help with this.  She is also noted increased frequency of headaches over the past few weeks.  Most relaxers have helped with this in the past.   ROS:  A comprehensive ROS was completed and negative except as noted per HPI    Allergies  Allergen Reactions   Penicillins Hives    Past Medical History:  Diagnosis Date   Allergy    Penicillin   Anxiety    Arthritis    Depression    Headache    Hyperlipidemia    Hypertension    Osteoporosis     Past Surgical History:  Procedure Laterality Date   BRAIN SURGERY     CEREBRAL MICROVASCULAR DECOMPRESSION     CESAREAN SECTION     COLONOSCOPY  06/05/2009   [name]   FOOT SURGERY     TRIGEMINAL NERVE DECOMPRESSION     x2    Social History   Socioeconomic History   Marital status: Divorced    Spouse name: Not on file   Number of children: 2   Years of education: 13   Highest education level: Some college, no degree  Occupational History   Occupation: retired  Tobacco Use   Smoking status: Former    Packs/day: 1.00    Years: 10.00    Total pack years: 10.00    Types: Cigarettes, E-cigarettes    Quit date: 09/04/2021    Years since quitting: 0.4   Smokeless tobacco: Never   Tobacco comments:    I quit  Vaping Use   Vaping Use: Never used  Substance and Sexual Activity   Alcohol use: Yes    Alcohol/week: 4.0 standard drinks of alcohol    Types: 4 Standard drinks or equivalent per week   Drug use: No   Sexual activity: Yes    Birth  control/protection: Post-menopausal, None  Other Topics Concern   Not on file  Social History Narrative   Lives alone. She does watch her grandson a few days a week.    Social Determinants of Health   Financial Resource Strain: Medium Risk (02/18/2022)   Overall Financial Resource Strain (CARDIA)    Difficulty of Paying Living Expenses: Somewhat hard  Food Insecurity: No Food Insecurity (02/18/2022)   Hunger Vital Sign    Worried About Running Out of Food in the Last Year: Never true    Ran Out of Food in the Last Year: Never true  Transportation Needs: No Transportation Needs (02/18/2022)   PRAPARE - Administrator, Civil Service (Medical): No    Lack of Transportation (Non-Medical): No  Physical Activity: Sufficiently Active (02/18/2022)   Exercise Vital Sign    Days of Exercise per Week: 5 days    Minutes of Exercise per Session: 60 min  Stress: No Stress Concern Present (02/18/2022)   [employer] of Occupational Health - Occupational Stress Questionnaire    Feeling of Stress : Only a little  Social Connections: Socially Isolated (02/19/2022)  Social Connection and Isolation Panel [NHANES]    Frequency of Communication with Friends and Family: More than three times a week    Frequency of Social Gatherings with Friends and Family: Three times a week    Attends Religious Services: Never    Active Member of Clubs or Organizations: No    Attends Banker Meetings: Never    Marital Status: Divorced    Family History  Problem Relation Age of Onset   Cancer Father        lung   Intellectual disability Father    Colon cancer Neg Hx    Esophageal cancer Neg Hx    Stomach cancer Neg Hx    Rectal cancer Neg Hx    Colon polyps Neg Hx     Health Maintenance  Topic Date Due   INFLUENZA VACCINE  03/04/2022   COVID-19 Vaccine (3 - Moderna series) 03/07/2022 (Originally 10/24/2019)   Zoster Vaccines- Shingrix (1 of 2) 04/05/2022 (Originally 08/22/2003)    Pneumonia Vaccine 76+ Years old (1 - PCV) 06/20/2022 (Originally 08/21/2018)   TETANUS/TDAP  01/04/2023 (Originally 08/21/1972)   MAMMOGRAM  01/23/2024   DEXA SCAN  01/23/2024   COLONOSCOPY (Pts 45-1yrs Insurance coverage will need to be confirmed)  10/11/2030   Hepatitis C Screening  Completed   HPV VACCINES  Aged Out     ----------------------------------------------------------------------------------------------------------------------------------------------------------------------------------------------------------------- Physical Exam BP 138/75 (BP Location: Left Arm, Patient Position: Sitting, Cuff Size: Normal)   Pulse 73   Ht 5' 6.5" (1.689 m)   Wt 136 lb (61.7 kg)   SpO2 97%   BMI 21.62 kg/m   Physical Exam Constitutional:      Appearance: Normal appearance.  Cardiovascular:     Rate and Rhythm: Normal rate and regular rhythm.  Neurological:     General: No focal deficit present.     Mental Status: She is alert.  Psychiatric:        Mood and Affect: Mood normal.        Behavior: Behavior normal.     ------------------------------------------------------------------------------------------------------------------------------------------------------------------------------------------------------------------- Assessment and Plan  Tremor Adding propranolol I think this may be helpful for her tremor as well as her current headaches.  We can titrate this as needed based on response and tolerance.  Osteoporosis We discussed options for treatment.  She prefers to take Fosamax.  Potential side effects reviewed.  Checking calcium and vitamin D levels today.  We will need to make sure these are optimal prior to starting.   Meds ordered this encounter  Medications   propranolol ER (INDERAL LA) 60 MG 24 hr capsule    Sig: Take 1 tab po daily for tremor and migraine prevention.    Dispense:  30 capsule    Refill:  3    Return in about 3 months (around 06/04/2022) for  HA/Tremor.    This visit occurred during the SARS-CoV-2 public health emergency.  Safety protocols were in place, including screening questions prior to the visit, additional usage of staff PPE, and extensive cleaning of exam room while observing appropriate contact time as indicated for disinfecting solutions.

## 2022-03-04 NOTE — Assessment & Plan Note (Signed)
Adding propranolol I think this may be helpful for her tremor as well as her current headaches.  We can titrate this as needed based on response and tolerance.

## 2022-03-05 LAB — COMPLETE METABOLIC PANEL WITH GFR
AG Ratio: 2.1 (calc) (ref 1.0–2.5)
ALT: 30 U/L — ABNORMAL HIGH (ref 6–29)
AST: 27 U/L (ref 10–35)
Albumin: 4.9 g/dL (ref 3.6–5.1)
Alkaline phosphatase (APISO): 78 U/L (ref 37–153)
BUN: 13 mg/dL (ref 7–25)
CO2: 28 mmol/L (ref 20–32)
Calcium: 10.7 mg/dL — ABNORMAL HIGH (ref 8.6–10.4)
Chloride: 101 mmol/L (ref 98–110)
Creat: 0.81 mg/dL (ref 0.50–1.05)
Globulin: 2.3 g/dL (calc) (ref 1.9–3.7)
Glucose, Bld: 74 mg/dL (ref 65–99)
Potassium: 3.9 mmol/L (ref 3.5–5.3)
Sodium: 137 mmol/L (ref 135–146)
Total Bilirubin: 0.5 mg/dL (ref 0.2–1.2)
Total Protein: 7.2 g/dL (ref 6.1–8.1)
eGFR: 79 mL/min/{1.73_m2} (ref >=60)

## 2022-03-05 LAB — CBC WITH DIFFERENTIAL/PLATELET
Absolute Monocytes: 722 cells/uL (ref 200–950)
Basophils Absolute: 38 cells/uL (ref 0–200)
Basophils Relative: 0.4 %
Eosinophils Absolute: 133 cells/uL (ref 15–500)
Eosinophils Relative: 1.4 %
HCT: 40.7 % (ref 35.0–45.0)
Hemoglobin: 13.9 g/dL (ref 11.7–15.5)
Lymphs Abs: 3088 cells/uL (ref 850–3900)
MCH: 31.8 pg (ref 27.0–33.0)
MCHC: 34.2 g/dL (ref 32.0–36.0)
MCV: 93.1 fL (ref 80.0–100.0)
MPV: 9.4 fL (ref 7.5–12.5)
Monocytes Relative: 7.6 %
Neutro Abs: 5520 cells/uL (ref 1500–7800)
Neutrophils Relative %: 58.1 %
Platelets: 226 10*3/uL (ref 140–400)
RBC: 4.37 10*6/uL (ref 3.80–5.10)
RDW: 12.5 % (ref 11.0–15.0)
Total Lymphocyte: 32.5 %
WBC: 9.5 10*3/uL (ref 3.8–10.8)

## 2022-03-05 LAB — TSH: TSH: 0.84 mIU/L (ref 0.40–4.50)

## 2022-03-05 LAB — VITAMIN D 25 HYDROXY (VIT D DEFICIENCY, FRACTURES): Vit D, 25-Hydroxy: 37 ng/mL (ref 30–100)

## 2022-03-17 ENCOUNTER — Other Ambulatory Visit: Payer: Self-pay | Admitting: Family Medicine

## 2022-03-17 ENCOUNTER — Encounter: Payer: Self-pay | Admitting: Family Medicine

## 2022-03-17 MED ORDER — DOXYCYCLINE HYCLATE 100 MG PO TABS: 100.0000 mg | ORAL_TABLET | Freq: Two times a day (BID) | ORAL | 0 refills | Status: DC

## 2022-03-17 MED ORDER — ALENDRONATE SODIUM 70 MG PO TABS: 70.0000 mg | ORAL_TABLET | ORAL | 11 refills | Status: DC

## 2022-03-17 NOTE — Telephone Encounter (Signed)
Fosamax sent in for osteoporosis.  Doxycycline sent in for sinus infection.

## 2022-03-22 ENCOUNTER — Other Ambulatory Visit: Payer: Self-pay | Admitting: Family Medicine

## 2022-03-26 ENCOUNTER — Encounter: Payer: Self-pay | Admitting: Family Medicine

## 2022-04-09 ENCOUNTER — Encounter: Payer: Self-pay | Admitting: Family Medicine

## 2022-05-21 ENCOUNTER — Other Ambulatory Visit: Payer: Self-pay | Admitting: Family Medicine

## 2022-06-01 ENCOUNTER — Other Ambulatory Visit: Payer: Self-pay | Admitting: Family Medicine

## 2022-06-04 ENCOUNTER — Ambulatory Visit (INDEPENDENT_AMBULATORY_CARE_PROVIDER_SITE_OTHER): Payer: Medicare HMO | Admitting: Family Medicine

## 2022-06-04 ENCOUNTER — Encounter: Payer: Self-pay | Admitting: Family Medicine

## 2022-06-04 VITALS — BP 137/70 | HR 60 | Ht 66.5 in | Wt 135.0 lb

## 2022-06-04 DIAGNOSIS — R251 Tremor, unspecified: Secondary | ICD-10-CM | POA: Diagnosis not present

## 2022-06-04 DIAGNOSIS — G44221 Chronic tension-type headache, intractable: Secondary | ICD-10-CM | POA: Diagnosis not present

## 2022-06-04 DIAGNOSIS — R69 Illness, unspecified: Secondary | ICD-10-CM | POA: Diagnosis not present

## 2022-06-04 DIAGNOSIS — I10 Essential (primary) hypertension: Secondary | ICD-10-CM | POA: Diagnosis not present

## 2022-06-04 DIAGNOSIS — F3341 Major depressive disorder, recurrent, in partial remission: Secondary | ICD-10-CM | POA: Diagnosis not present

## 2022-06-04 MED ORDER — PRIMIDONE 50 MG PO TABS: ORAL_TABLET | ORAL | 3 refills | Status: DC

## 2022-06-04 MED ORDER — AMITRIPTYLINE HCL 25 MG PO TABS: ORAL_TABLET | ORAL | 3 refills | Status: DC

## 2022-06-04 NOTE — Patient Instructions (Signed)
Try primidone for tremor.  Amitriptyline for headache.  See me again in 3 months.

## 2022-06-08 NOTE — Assessment & Plan Note (Signed)
We discussed additional options including primidone.  We will start this and titrate based on tolerance and response.

## 2022-06-08 NOTE — Progress Notes (Signed)
Caitlyn Hill - 66 y.o. female MRN [id number]  Date of birth: Oct 14, 1955  Subjective Chief Complaint  Patient presents with   Follow-up    HPI Caitlyn Hill is a 66 year old female here today for a follow-up visit.  She reports that her tremor seems of gotten worse.  Tremor is most noticeable when trying to do activities.  We did try beta-blocker however this did not seem to work very well.  She has never tried primidone.  She asked about trying benzodiazepine such as Xanax to help with this.  Additionally, she continues to have headaches.  She has been using muscle relaxers in the past which do not seem to be working as well at this point.  These do not seem to be migrainous in nature.  Propranolol did not seem to help very much.    Continues on amlodipine for management of hypertension.  Blood pressure remains well controlled with this.  Denies chest pain or dyspnea.   ROS:  A comprehensive ROS was completed and negative except as noted per HPI    Allergies  Allergen Reactions   Penicillins Hives    Past Medical History:  Diagnosis Date   Allergy    Penicillin   Anxiety    Arthritis    Depression    Headache    Hyperlipidemia    Hypertension    Osteoporosis     Past Surgical History:  Procedure Laterality Date   BRAIN SURGERY     CEREBRAL MICROVASCULAR DECOMPRESSION     CESAREAN SECTION     COLONOSCOPY  06/05/2009   [name]   FOOT SURGERY     TRIGEMINAL NERVE DECOMPRESSION     x2    Social History   Socioeconomic History   Marital status: Divorced    Spouse name: Not on file   Number of children: 2   Years of education: 13   Highest education level: Some college, no degree  Occupational History   Occupation: retired  Tobacco Use   Smoking status: Former    Packs/day: 1.00    Years: 10.00    Total pack years: 10.00    Types: Cigarettes, E-cigarettes    Quit date: 09/04/2021    Years since quitting: 0.7   Smokeless tobacco: Never   Tobacco comments:     I quit  Vaping Use   Vaping Use: Never used  Substance and Sexual Activity   Alcohol use: Yes    Alcohol/week: 4.0 standard drinks of alcohol    Types: 4 Standard drinks or equivalent per week   Drug use: No   Sexual activity: Yes    Birth control/protection: Post-menopausal, None  Other Topics Concern   Not on file  Social History Narrative   Lives alone. She does watch her grandson a few days a week.    Social Determinants of Health   Financial Resource Strain: Medium Risk (02/18/2022)   Overall Financial Resource Strain (CARDIA)    Difficulty of Paying Living Expenses: Somewhat hard  Food Insecurity: No Food Insecurity (02/18/2022)   Hunger Vital Sign    Worried About Running Out of Food in the Last Year: Never true    Ran Out of Food in the Last Year: Never true  Transportation Needs: No Transportation Needs (02/18/2022)   PRAPARE - Administrator, Civil Service (Medical): No    Lack of Transportation (Non-Medical): No  Physical Activity: Sufficiently Active (02/18/2022)   Exercise Vital Sign    Days of Exercise  per Week: 5 days    Minutes of Exercise per Session: 60 min  Stress: No Stress Concern Present (02/18/2022)   [employer] of Occupational Health - Occupational Stress Questionnaire    Feeling of Stress : Only a little  Social Connections: Socially Isolated (02/19/2022)   Social Connection and Isolation Panel [NHANES]    Frequency of Communication with Friends and Family: More than three times a week    Frequency of Social Gatherings with Friends and Family: Three times a week    Attends Religious Services: Never    Active Member of Clubs or Organizations: No    Attends Banker Meetings: Never    Marital Status: Divorced    Family History  Problem Relation Age of Onset   Cancer Father        lung   Intellectual disability Father    Colon cancer Neg Hx    Esophageal cancer Neg Hx    Stomach cancer Neg Hx    Rectal cancer Neg  Hx    Colon polyps Neg Hx     Health Maintenance  Topic Date Due   Pneumonia Vaccine 49+ Years old (1 - PCV) 06/20/2022 (Originally 08/21/2018)   Zoster Vaccines- Shingrix (1 of 2) 09/04/2022 (Originally 08/22/2003)   INFLUENZA VACCINE  11/02/2022 (Originally 03/04/2022)   COVID-19 Vaccine (3 - Moderna series) 11/02/2022 (Originally 10/24/2019)   TETANUS/TDAP  01/04/2023 (Originally 08/21/1972)   Medicare Annual Wellness ([facility])  02/20/2023   MAMMOGRAM  01/23/2024   DEXA SCAN  01/23/2024   COLONOSCOPY (Pts 45-1yrs Insurance coverage will need to be confirmed)  10/11/2030   Hepatitis C Screening  Completed   HPV VACCINES  Aged Out     ----------------------------------------------------------------------------------------------------------------------------------------------------------------------------------------------------------------- Physical Exam BP 137/70 (BP Location: Right Arm, Patient Position: Sitting, Cuff Size: Normal)   Pulse 60   Ht 5' 6.5" (1.689 m)   Wt 135 lb (61.2 kg)   SpO2 95%   BMI 21.46 kg/m   Physical Exam Constitutional:      Appearance: Normal appearance.  HENT:     Head: Normocephalic and atraumatic.  Eyes:     General: No scleral icterus. Cardiovascular:     Rate and Rhythm: Normal rate and regular rhythm.  Pulmonary:     Effort: Pulmonary effort is normal.     Breath sounds: Normal breath sounds.  Musculoskeletal:     Cervical back: Neck supple.  Neurological:     Mental Status: She is alert.  Psychiatric:        Mood and Affect: Mood normal.        Behavior: Behavior normal.     ------------------------------------------------------------------------------------------------------------------------------------------------------------------------------------------------------------------- Assessment and Plan  Essential hypertension Blood pressure remains well controlled with amlodipine and current strength.  We will plan to continue  this.  Tremor We discussed additional options including primidone.  We will start this and titrate based on tolerance and response.  Chronic tension headache Stopping Flexeril.  We will see if she has improvement with amitriptyline as she does have associated depression and anxiety as well.   Meds ordered this encounter  Medications   amitriptyline (ELAVIL) 25 MG tablet    Sig: Take 1 tab po qhs    Dispense:  30 tablet    Refill:  3   primidone (MYSOLINE) 50 MG tablet    Sig: Take 1/2 tab x7 days then increase to 1 full tab.  May increase by 50mg  each week to a maximum of 200mg  if needed for tremor.  Dispense:  60 tablet    Refill:  3    Return in about 3 months (around 09/04/2022) for F/u Tremor/HA.    This visit occurred during the SARS-CoV-2 public health emergency.  Safety protocols were in place, including screening questions prior to the visit, additional usage of staff PPE, and extensive cleaning of exam room while observing appropriate contact time as indicated for disinfecting solutions.

## 2022-06-08 NOTE — Assessment & Plan Note (Signed)
Blood pressure remains well controlled with amlodipine and current strength.  We will plan to continue this.

## 2022-06-08 NOTE — Assessment & Plan Note (Signed)
Stopping Flexeril.  We will see if she has improvement with amitriptyline as she does have associated depression and anxiety as well.

## 2022-06-18 ENCOUNTER — Other Ambulatory Visit: Payer: Self-pay | Admitting: Family Medicine

## 2022-06-28 ENCOUNTER — Other Ambulatory Visit: Payer: Self-pay | Admitting: Family Medicine

## 2022-07-01 ENCOUNTER — Other Ambulatory Visit: Payer: Self-pay | Admitting: Family Medicine

## 2022-07-08 ENCOUNTER — Other Ambulatory Visit: Payer: Self-pay | Admitting: Family Medicine

## 2022-08-13 ENCOUNTER — Ambulatory Visit (INDEPENDENT_AMBULATORY_CARE_PROVIDER_SITE_OTHER): Payer: Medicare HMO

## 2022-08-13 ENCOUNTER — Ambulatory Visit (INDEPENDENT_AMBULATORY_CARE_PROVIDER_SITE_OTHER): Payer: Medicare HMO | Admitting: Family Medicine

## 2022-08-13 ENCOUNTER — Encounter: Payer: Self-pay | Admitting: Family Medicine

## 2022-08-13 VITALS — BP 143/77 | HR 65 | Ht 66.5 in | Wt 142.0 lb

## 2022-08-13 DIAGNOSIS — M7061 Trochanteric bursitis, right hip: Secondary | ICD-10-CM | POA: Diagnosis not present

## 2022-08-13 DIAGNOSIS — M533 Sacrococcygeal disorders, not elsewhere classified: Secondary | ICD-10-CM | POA: Insufficient documentation

## 2022-08-13 MED ORDER — METHYLPREDNISOLONE ACETATE 40 MG/ML IJ SUSP
40.0000 mg | Freq: Once | INTRAMUSCULAR | Status: AC
  Administered 2022-08-13: 40 mg via INTRAMUSCULAR

## 2022-08-13 MED ORDER — PREDNISONE 50 MG PO TABS: ORAL_TABLET | ORAL | 0 refills | Status: DC

## 2022-08-13 MED ORDER — HYDROCODONE-ACETAMINOPHEN 5-325 MG PO TABS: 1.0000 | ORAL_TABLET | Freq: Four times a day (QID) | ORAL | 0 refills | Status: DC | PRN

## 2022-08-13 NOTE — Assessment & Plan Note (Signed)
X-rays of the sacrum and coccyx ordered today.  Given injection of Depo-Medrol and will continue with prednisone burst.  Short-term Norco added for coccygeal and hip pain.

## 2022-08-13 NOTE — Progress Notes (Signed)
Caitlyn Hill - 67 y.o. female MRN [id number]  Date of birth: 05-29-1956  Subjective Chief Complaint  Patient presents with   Tailbone Pain    HPI Caitlyn Hill is a 67 year old female here today with complaint of bilateral hip pain as well as coccygeal pain.  She does not recall any injury or known overuse.  She has had bursitis of her hips in the past and this feels similar.  Systemic steroid injections seem to work well for her in the past.  She does not recall having steroid injections into the bursa itself.  Symptoms started about 2 weeks ago.  She denies any radiation, numbness or tingling.  She has had difficulty walking due to the pain.  She has not tried anything at home for treatment so far.  ROS:  A comprehensive ROS was completed and negative except as noted per HPI  Allergies  Allergen Reactions   Penicillins Hives    Past Medical History:  Diagnosis Date   Allergy    Penicillin   Anxiety    Arthritis    Depression    Headache    Hyperlipidemia    Hypertension    Osteoporosis     Past Surgical History:  Procedure Laterality Date   BRAIN SURGERY     CEREBRAL MICROVASCULAR DECOMPRESSION     CESAREAN SECTION     COLONOSCOPY  06/05/2009   [name]   FOOT SURGERY     TRIGEMINAL NERVE DECOMPRESSION     x2    Social History   Socioeconomic History   Marital status: Divorced    Spouse name: Not on file   Number of children: 2   Years of education: 13   Highest education level: Some college, no degree  Occupational History   Occupation: retired  Tobacco Use   Smoking status: Former    Packs/day: 1.00    Years: 10.00    Total pack years: 10.00    Types: Cigarettes, E-cigarettes    Quit date: 09/04/2021    Years since quitting: 0.9   Smokeless tobacco: Never   Tobacco comments:    I quit  Vaping Use   Vaping Use: Never used  Substance and Sexual Activity   Alcohol use: Yes    Alcohol/week: 4.0 standard drinks of alcohol    Types: 4 Standard drinks or  equivalent per week   Drug use: No   Sexual activity: Yes    Birth control/protection: Post-menopausal, None  Other Topics Concern   Not on file  Social History Narrative   Lives alone. She does watch her grandson a few days a week.    Social Determinants of Health   Financial Resource Strain: Medium Risk (02/18/2022)   Overall Financial Resource Strain (CARDIA)    Difficulty of Paying Living Expenses: Somewhat hard  Food Insecurity: No Food Insecurity (02/18/2022)   Hunger Vital Sign    Worried About Running Out of Food in the Last Year: Never true    Ran Out of Food in the Last Year: Never true  Transportation Needs: No Transportation Needs (02/18/2022)   PRAPARE - Hydrologist (Medical): No    Lack of Transportation (Non-Medical): No  Physical Activity: Sufficiently Active (02/18/2022)   Exercise Vital Sign    Days of Exercise per Week: 5 days    Minutes of Exercise per Session: 60 min  Stress: No Stress Concern Present (02/18/2022)   [place]  Feeling of Stress : Only a little  Social Connections: Socially Isolated (02/19/2022)   Social Connection and Isolation Panel [NHANES]    Frequency of Communication with Friends and Family: More than three times a week    Frequency of Social Gatherings with Friends and Family: Three times a week    Attends Religious Services: Never    Active Member of Clubs or Organizations: No    Attends Archivist Meetings: Never    Marital Status: Divorced    Family History  Problem Relation Age of Onset   Cancer Father        lung   Intellectual disability Father    Colon cancer Neg Hx    Esophageal cancer Neg Hx    Stomach cancer Neg Hx    Rectal cancer Neg Hx    Colon polyps Neg Hx     Health Maintenance  Topic Date Due   Zoster Vaccines- Shingrix (1 of 2) 09/04/2022 (Originally 08/22/2003)   INFLUENZA VACCINE  11/02/2022  (Originally 03/04/2022)   COVID-19 Vaccine (3 - 2023-24 season) 11/02/2022 (Originally 04/04/2022)   Pneumonia Vaccine 33+ Years old (1 - PCV) 08/14/2023 (Originally 08/21/2018)   Medicare Annual Wellness ([place])  02/20/2023   MAMMOGRAM  01/23/2024   DEXA SCAN  01/23/2024   COLONOSCOPY (Pts 45-34yrs Insurance coverage will need to be confirmed)  10/11/2030   Hepatitis C Screening  Completed   HPV VACCINES  Aged Out   DTaP/Tdap/Td  Discontinued     ----------------------------------------------------------------------------------------------------------------------------------------------------------------------------------------------------------------- Physical Exam BP (!) 143/77 (BP Location: Left Arm, Patient Position: Sitting, Cuff Size: Normal)   Pulse 65   Ht 5' 6.5" (1.689 m)   Wt 142 lb (64.4 kg)   SpO2 98%   BMI 22.58 kg/m   Physical Exam Constitutional:      Appearance: Normal appearance.  Musculoskeletal:     Comments: Range of motion the hip bilaterally is normal.  Tenderness to palpation along the greater trochanter bilaterally right greater than left.  Neurological:     General: No focal deficit present.     Mental Status: She is alert.  Psychiatric:        Mood and Affect: Mood normal.        Behavior: Behavior normal.     ------------------------------------------------------------------------------------------------------------------------------------------------------------------------------------------------------------------- Assessment and Plan  Coccydynia X-rays of the sacrum and coccyx ordered today.  Given injection of Depo-Medrol and will continue with prednisone burst.  Short-term Norco added for coccygeal and hip pain.  Trochanteric bursitis, right hip Given injection of Depo-Medrol and will continue with prednisone burst.  If not improving will have her schedule with Dr. Dianah Field for injection of bursa.   Meds ordered this encounter   Medications   HYDROcodone-acetaminophen (NORCO) 5-325 MG tablet    Sig: Take 1 tablet by mouth every 6 (six) hours as needed for moderate pain.    Dispense:  15 tablet    Refill:  0   predniSONE (DELTASONE) 50 MG tablet    Sig: Take 50mg  daily x5 days.    Dispense:  5 tablet    Refill:  0   methylPREDNISolone acetate (DEPO-MEDROL) injection 40 mg    No follow-ups on file.    This visit occurred during the SARS-CoV-2 public health emergency.  Safety protocols were in place, including screening questions prior to the visit, additional usage of staff PPE, and extensive cleaning of exam room while observing appropriate contact time as indicated for disinfecting solutions.

## 2022-08-13 NOTE — Assessment & Plan Note (Signed)
Given injection of Depo-Medrol and will continue with prednisone burst.  If not improving will have her schedule with Dr. Dianah Field for injection of bursa.

## 2022-09-04 ENCOUNTER — Other Ambulatory Visit: Payer: Self-pay | Admitting: Family Medicine

## 2022-09-04 ENCOUNTER — Ambulatory Visit (INDEPENDENT_AMBULATORY_CARE_PROVIDER_SITE_OTHER): Payer: Medicare HMO | Admitting: Family Medicine

## 2022-09-04 ENCOUNTER — Encounter: Payer: Self-pay | Admitting: Family Medicine

## 2022-09-04 VITALS — BP 132/78 | HR 83 | Ht 66.5 in | Wt 142.0 lb

## 2022-09-04 DIAGNOSIS — G250 Essential tremor: Secondary | ICD-10-CM | POA: Diagnosis not present

## 2022-09-04 DIAGNOSIS — M533 Sacrococcygeal disorders, not elsewhere classified: Secondary | ICD-10-CM

## 2022-09-04 DIAGNOSIS — I10 Essential (primary) hypertension: Secondary | ICD-10-CM | POA: Diagnosis not present

## 2022-09-04 MED ORDER — NAPROXEN 500 MG PO TABS: 500.0000 mg | ORAL_TABLET | Freq: Two times a day (BID) | ORAL | 0 refills | Status: DC

## 2022-09-04 MED ORDER — HYDROCODONE-ACETAMINOPHEN 5-325 MG PO TABS: 1.0000 | ORAL_TABLET | Freq: Four times a day (QID) | ORAL | 0 refills | Status: DC | PRN

## 2022-09-07 DIAGNOSIS — M48061 Spinal stenosis, lumbar region without neurogenic claudication: Secondary | ICD-10-CM | POA: Insufficient documentation

## 2022-09-07 DIAGNOSIS — M533 Sacrococcygeal disorders, not elsewhere classified: Secondary | ICD-10-CM | POA: Insufficient documentation

## 2022-09-07 NOTE — Assessment & Plan Note (Signed)
Blood pressure is well-controlled with current medications.  Recommend continuation.

## 2022-09-07 NOTE — Assessment & Plan Note (Signed)
Continues to have pain along the sacrum and coccyx.  No recent trauma and x-rays negative.  Adding naproxen and recommend scheduling Dr. Dianah Field for possible SI injection.

## 2022-09-07 NOTE — Progress Notes (Signed)
Caitlyn Hill - 67 y.o. female MRN [id number]  Date of birth: March 15, 1956  Subjective Chief Complaint  Patient presents with   Tailbone Pain    HPI Caitlyn Hill is a 67 year old female here today for follow-up visit.  Reports she continues to have pain in her sacrococcygeal region.  X-rays previously did show degenerative changes of the lumbosacral spine.  Steroid injection with prednisone burst given previously.  She reports only temporary relief from this.  She will also use Norco which improved pain control.  She is requesting a renewal of this.  Continues on amlodipine and propranolol for management of hypertension.  Prolonged also for management of chronic tremor and migraines.  No significant side effects of medications at current strength.  She denies symptoms related to hypertension including chest pain, shortness of breath, palpitations,or vision changes.  ROS:  A comprehensive ROS was completed and negative except as noted per HPI  Allergies  Allergen Reactions   Penicillins Hives    Past Medical History:  Diagnosis Date   Allergy    Penicillin   Anxiety    Arthritis    Depression    Headache    Hyperlipidemia    Hypertension    Osteoporosis     Past Surgical History:  Procedure Laterality Date   BRAIN SURGERY     CEREBRAL MICROVASCULAR DECOMPRESSION     CESAREAN SECTION     COLONOSCOPY  06/05/2009   Caitlyn Hill   FOOT SURGERY     TRIGEMINAL NERVE DECOMPRESSION     x2    Social History   Socioeconomic History   Marital status: Divorced    Spouse name: Not on file   Number of children: 2   Years of education: 13   Highest education level: Some college, no degree  Occupational History   Occupation: retired  Tobacco Use   Smoking status: Former    Packs/day: 1.00    Years: 10.00    Total pack years: 10.00    Types: Cigarettes, E-cigarettes    Quit date: 09/04/2021    Years since quitting: 1.0   Smokeless tobacco: Never   Tobacco comments:    I quit  Vaping  Use   Vaping Use: Never used  Substance and Sexual Activity   Alcohol use: Yes    Alcohol/week: 4.0 standard drinks of alcohol    Types: 4 Standard drinks or equivalent per week   Drug use: No   Sexual activity: Yes    Birth control/protection: Post-menopausal, None  Other Topics Concern   Not on file  Social History Narrative   Lives alone. She does watch her grandson a few days a week.    Social Determinants of Health   Financial Resource Strain: Medium Risk (02/18/2022)   Overall Financial Resource Strain (CARDIA)    Difficulty of Paying Living Expenses: Somewhat hard  Food Insecurity: No Food Insecurity (02/18/2022)   Hunger Vital Sign    Worried About Running Out of Food in the Last Year: Never true    Ran Out of Food in the Last Year: Never true  Transportation Needs: No Transportation Needs (02/18/2022)   PRAPARE - Hydrologist (Medical): No    Lack of Transportation (Non-Medical): No  Physical Activity: Sufficiently Active (02/18/2022)   Exercise Vital Sign    Days of Exercise per Week: 5 days    Minutes of Exercise per Session: 60 min  Stress: No Stress Concern Present (02/18/2022)   [place] -  Occupational Stress Questionnaire    Feeling of Stress : Only a little  Social Connections: Socially Isolated (02/19/2022)   Social Connection and Isolation Panel [NHANES]    Frequency of Communication with Friends and Family: More than three times a week    Frequency of Social Gatherings with Friends and Family: Three times a week    Attends Religious Services: Never    Active Member of Clubs or Organizations: No    Attends Archivist Meetings: Never    Marital Status: Divorced    Family History  Problem Relation Age of Onset   Cancer Father        lung   Intellectual disability Father    Colon cancer Neg Hx    Esophageal cancer Neg Hx    Stomach cancer Neg Hx    Rectal cancer Neg Hx    Colon polyps  Neg Hx     Health Maintenance  Topic Date Due   Zoster Vaccines- Shingrix (1 of 2) Never done   INFLUENZA VACCINE  11/02/2022 (Originally 03/04/2022)   COVID-19 Vaccine (3 - 2023-24 season) 11/02/2022 (Originally 04/04/2022)   Pneumonia Vaccine 51+ Years old (1 - PCV) 08/14/2023 (Originally 08/21/2018)   Medicare Annual Wellness ([place])  02/20/2023   MAMMOGRAM  01/23/2024   DEXA SCAN  01/23/2024   COLONOSCOPY (Pts 45-50yrs Insurance coverage will need to be confirmed)  10/11/2030   Hepatitis C Screening  Completed   HPV VACCINES  Aged Out   DTaP/Tdap/Td  Discontinued     ----------------------------------------------------------------------------------------------------------------------------------------------------------------------------------------------------------------- Physical Exam BP 132/78 (BP Location: Left Arm, Patient Position: Sitting, Cuff Size: Normal)   Pulse 83   Ht 5' 6.5" (1.689 m)   Wt 142 lb (64.4 kg)   SpO2 97%   BMI 22.58 kg/m   Physical Exam Constitutional:      Appearance: Normal appearance.  Eyes:     General: No scleral icterus. Cardiovascular:     Rate and Rhythm: Normal rate and regular rhythm.  Pulmonary:     Effort: Pulmonary effort is normal.     Breath sounds: Normal breath sounds.  Musculoskeletal:     Cervical back: Neck supple.  Neurological:     Mental Status: She is alert.  Psychiatric:        Mood and Affect: Mood normal.        Behavior: Behavior normal.     ------------------------------------------------------------------------------------------------------------------------------------------------------------------------------------------------------------------- Assessment and Plan  Essential hypertension Blood pressure is well-controlled with current medications.  Recommend continuation.  Essential tremor Continue primidone and propranolol at current strength.  Coccydynia Continues to have pain along the sacrum and  coccyx.  No recent trauma and x-rays negative.  Adding naproxen and recommend scheduling Dr. Dianah Field for possible SI injection.   Meds ordered this encounter  Medications   naproxen (NAPROSYN) 500 MG tablet    Sig: Take 1 tablet (500 mg total) by mouth 2 (two) times daily with a meal.    Dispense:  20 tablet    Refill:  0   HYDROcodone-acetaminophen (NORCO) 5-325 MG tablet    Sig: Take 1 tablet by mouth every 6 (six) hours as needed for moderate pain.    Dispense:  15 tablet    Refill:  0    No follow-ups on file.    This visit occurred during the SARS-CoV-2 public health emergency.  Safety protocols were in place, including screening questions prior to the visit, additional usage of staff PPE, and extensive cleaning of exam room while observing appropriate contact time  as indicated for disinfecting solutions.

## 2022-09-07 NOTE — Assessment & Plan Note (Signed)
Continue primidone and propranolol at current strength.

## 2022-09-09 ENCOUNTER — Ambulatory Visit (INDEPENDENT_AMBULATORY_CARE_PROVIDER_SITE_OTHER): Payer: Medicare HMO | Admitting: Sports Medicine

## 2022-09-09 DIAGNOSIS — M7541 Impingement syndrome of right shoulder: Secondary | ICD-10-CM | POA: Diagnosis not present

## 2022-09-09 DIAGNOSIS — M7542 Impingement syndrome of left shoulder: Secondary | ICD-10-CM | POA: Diagnosis not present

## 2022-09-09 DIAGNOSIS — M48061 Spinal stenosis, lumbar region without neurogenic claudication: Secondary | ICD-10-CM

## 2022-09-09 MED ORDER — HYDROCODONE-ACETAMINOPHEN 5-325 MG PO TABS: 1.0000 | ORAL_TABLET | Freq: Three times a day (TID) | ORAL | 0 refills | Status: DC | PRN

## 2022-09-09 MED ORDER — GABAPENTIN 300 MG PO CAPS: ORAL_CAPSULE | ORAL | 3 refills | Status: DC

## 2022-09-09 NOTE — Assessment & Plan Note (Addendum)
Pleasant 66 year old female, she has had several months of pain axial low back, left-sided with radiation down to the coccyx as well as down the left leg to the foot. She has had greater than 6 weeks of conservative treatment including steroids, physician directed home physical therapy, she did have x-rays that showed multilevel lumbar spondylosis. Pain is better with flexion. I suspect lumbar spinal stenosis, proceeding with MRI, refilling her pain medication, adding Neurontin at night. Additional home conditioning given, return to see me in about 6 weeks, we will likely order her lumbar epidural after I see the results of the MRI. Of note pain radiating into the sacrum and coccyx is most likely referred from the lumbar spine.  She does not endorse any history of coccygeal trauma.  Update: MRI reviewed, dominant finding was degenerative disc disease L5-S1 without obvious neuroforaminal impingement, there is also moderate L5-S1 bilateral facet arthritis, we will start with a left L5-S1 interlaminar epidural, we will proceed then with a bilateral L5-S1 facet injection if insufficient improvement with the epidural.

## 2022-09-09 NOTE — Assessment & Plan Note (Signed)
Bilateral shoulder impingement syndrome, adding bilateral rotator cuff conditioning, return to see me in 6 weeks for this.

## 2022-09-09 NOTE — Progress Notes (Addendum)
    Procedures performed today:    None.  Independent interpretation of notes and tests performed by another provider:   None.  Brief History, Exam, Impression, and Recommendations:    Lumbar spinal stenosis Pleasant 67 year old female, she has had several months of pain axial low back, left-sided with radiation down to the coccyx as well as down the left leg to the foot. She has had greater than 6 weeks of conservative treatment including steroids, physician directed home physical therapy, she did have x-rays that showed multilevel lumbar spondylosis. Pain is better with flexion. I suspect lumbar spinal stenosis, proceeding with MRI, refilling her pain medication, adding Neurontin at night. Additional home conditioning given, return to see me in about 6 weeks, we will likely order her lumbar epidural after I see the results of the MRI. Of note pain radiating into the sacrum and coccyx is most likely referred from the lumbar spine.  She does not endorse any history of coccygeal trauma.  Update: MRI reviewed, dominant finding was degenerative disc disease L5-S1 without obvious neuroforaminal impingement, there is also moderate L5-S1 bilateral facet arthritis, we will start with a left L5-S1 interlaminar epidural, we will proceed then with a bilateral L5-S1 facet injection if insufficient improvement with the epidural.  Impingement syndrome of both shoulders Bilateral shoulder impingement syndrome, adding bilateral rotator cuff conditioning, return to see me in 6 weeks for this.    ____________________________________________ Gwen Her. Dianah Field, M.D., ABFM., CAQSM., AME. Primary Care and Sports Medicine [facility]  Adjunct Professor of [employer]  Risk manager

## 2022-09-14 ENCOUNTER — Ambulatory Visit (INDEPENDENT_AMBULATORY_CARE_PROVIDER_SITE_OTHER): Payer: Medicare HMO

## 2022-09-14 DIAGNOSIS — M48061 Spinal stenosis, lumbar region without neurogenic claudication: Secondary | ICD-10-CM

## 2022-09-14 DIAGNOSIS — M47816 Spondylosis without myelopathy or radiculopathy, lumbar region: Secondary | ICD-10-CM | POA: Diagnosis not present

## 2022-09-14 DIAGNOSIS — M5126 Other intervertebral disc displacement, lumbar region: Secondary | ICD-10-CM | POA: Diagnosis not present

## 2022-09-16 NOTE — Addendum Note (Signed)
Addended by: Silverio Decamp on: 09/16/2022 10:57 AM   Modules accepted: Orders

## 2022-09-24 ENCOUNTER — Encounter (INDEPENDENT_AMBULATORY_CARE_PROVIDER_SITE_OTHER): Payer: Medicare HMO | Admitting: Sports Medicine

## 2022-09-24 DIAGNOSIS — M48061 Spinal stenosis, lumbar region without neurogenic claudication: Secondary | ICD-10-CM | POA: Diagnosis not present

## 2022-09-25 ENCOUNTER — Other Ambulatory Visit: Payer: Self-pay | Admitting: Family Medicine

## 2022-09-25 MED ORDER — PREGABALIN 75 MG PO CAPS: 75.0000 mg | ORAL_CAPSULE | Freq: Three times a day (TID) | ORAL | 3 refills | Status: DC

## 2022-09-25 NOTE — Telephone Encounter (Signed)
I spent 5 total minutes of online digital evaluation and management services in this patient-initiated request for online care. 

## 2022-09-29 ENCOUNTER — Ambulatory Visit
Admission: RE | Admit: 2022-09-29 | Discharge: 2022-09-29 | Disposition: A | Discharge status: Home / Self Care | Payer: Medicare HMO | Source: Ambulatory Visit | Attending: Sports Medicine | Admitting: Sports Medicine

## 2022-09-29 DIAGNOSIS — M47817 Spondylosis without myelopathy or radiculopathy, lumbosacral region: Secondary | ICD-10-CM | POA: Diagnosis not present

## 2022-09-29 DIAGNOSIS — M48061 Spinal stenosis, lumbar region without neurogenic claudication: Secondary | ICD-10-CM

## 2022-09-29 MED ORDER — IOPAMIDOL (ISOVUE-M 200) INJECTION 41%
1.0000 mL | Freq: Once | INTRAMUSCULAR | Status: AC
  Administered 2022-09-29: 1 mL via EPIDURAL

## 2022-09-29 MED ORDER — METHYLPREDNISOLONE ACETATE 40 MG/ML INJ SUSP (RADIOLOG
80.0000 mg | Freq: Once | INTRAMUSCULAR | Status: AC
  Administered 2022-09-29: 80 mg via EPIDURAL

## 2022-09-29 NOTE — Discharge Instructions (Signed)

## 2022-10-08 ENCOUNTER — Encounter (INDEPENDENT_AMBULATORY_CARE_PROVIDER_SITE_OTHER): Payer: Medicare HMO | Admitting: Sports Medicine

## 2022-10-08 DIAGNOSIS — M5030 Other cervical disc degeneration, unspecified cervical region: Secondary | ICD-10-CM | POA: Diagnosis not present

## 2022-10-09 DIAGNOSIS — M5030 Other cervical disc degeneration, unspecified cervical region: Secondary | ICD-10-CM | POA: Insufficient documentation

## 2022-10-09 NOTE — Telephone Encounter (Signed)
I spent 5 total minutes of online digital evaluation and management services in this patient-initiated request for online care. 

## 2022-10-09 NOTE — Assessment & Plan Note (Signed)
New onset periscapular pain suspect cervical DDD, adding x-rays, home PT, return to see me in 6 weeks.

## 2022-10-11 ENCOUNTER — Other Ambulatory Visit: Payer: Self-pay | Admitting: Family Medicine

## 2022-10-11 ENCOUNTER — Encounter: Payer: Self-pay | Admitting: Sports Medicine

## 2022-10-13 ENCOUNTER — Telehealth: Payer: Self-pay

## 2022-10-13 NOTE — Telephone Encounter (Signed)
Looks like she has some questions about anxiety, I have asked her to discuss this with you.

## 2022-10-13 NOTE — Telephone Encounter (Signed)
Initiated Prior authorization [id number] '75MG'$  capsules Via: Covermymeds Case/Key:[id number] Status: approved  as of 10/13/22 Reason:Authorization Expiration Date: 08/04/2023  Notified Pt via: Mychart

## 2022-10-16 ENCOUNTER — Ambulatory Visit (INDEPENDENT_AMBULATORY_CARE_PROVIDER_SITE_OTHER): Payer: Medicare HMO

## 2022-10-16 DIAGNOSIS — M542 Cervicalgia: Secondary | ICD-10-CM

## 2022-10-16 DIAGNOSIS — M5030 Other cervical disc degeneration, unspecified cervical region: Secondary | ICD-10-CM

## 2022-10-21 ENCOUNTER — Ambulatory Visit (INDEPENDENT_AMBULATORY_CARE_PROVIDER_SITE_OTHER): Payer: Medicare HMO | Admitting: Sports Medicine

## 2022-10-21 DIAGNOSIS — M542 Cervicalgia: Secondary | ICD-10-CM

## 2022-10-21 DIAGNOSIS — M5030 Other cervical disc degeneration, unspecified cervical region: Secondary | ICD-10-CM | POA: Diagnosis not present

## 2022-10-21 DIAGNOSIS — M48061 Spinal stenosis, lumbar region without neurogenic claudication: Secondary | ICD-10-CM | POA: Diagnosis not present

## 2022-10-21 MED ORDER — HYDROCODONE-ACETAMINOPHEN 5-325 MG PO TABS: 1.0000 | ORAL_TABLET | Freq: Three times a day (TID) | ORAL | 0 refills | Status: DC | PRN

## 2022-10-21 NOTE — Progress Notes (Signed)
    Procedures performed today:    None.  Independent interpretation of notes and tests performed by another provider:   None.  Brief History, Exam, Impression, and Recommendations:    DDD (degenerative disc disease), cervical Periscapular pain, x-rays confirm cervical DDD, she has not x-rays, PT. Proceeding with MRI as she has had insufficient relief with conservative treatment. We will hold off on epidural injection for now.  Lumbar spinal stenosis Kharma is a pleasant 67 year old female, she has had many months of axial left-sided low back pain with radiation to the coccyx, left leg to the foot. We obtained an MRI that showed multilevel spondylitic processes, the dominant findings were degenerative disc disease L5-S1 without obvious neuroforaminal impingement, there is also moderate L5-S1 bilateral facet arthritis, she had 50% relief with a left L5-S1 interlaminar epidural, we will proceed with L5-S1 interlaminar epidural #2 of 3. After her third epidural if insufficient relief we could certainly target her L5-S1 facet joints. She did not want to take gabapentin or Lyrica due to the side effect of weight gain. I will refill her hydrocodone that she uses up to 3 times daily but she understands that we do need pain management for assistance with long-term opiate treatment. I am going to going to the referral to pain management to get the ball rolling.    ____________________________________________ Gwen Her. Dianah Field, M.D., ABFM., CAQSM., AME. Primary Care and Sports Medicine [facility]  Adjunct Professor of [place] of [facility] of Medicine  Risk manager

## 2022-10-21 NOTE — Assessment & Plan Note (Signed)
Maisah is a pleasant 67 year old female, she has had many months of axial left-sided low back pain with radiation to the coccyx, left leg to the foot. We obtained an MRI that showed multilevel spondylitic processes, the dominant findings were degenerative disc disease L5-S1 without obvious neuroforaminal impingement, there is also moderate L5-S1 bilateral facet arthritis, she had 50% relief with a left L5-S1 interlaminar epidural, we will proceed with L5-S1 interlaminar epidural #2 of 3. After her third epidural if insufficient relief we could certainly target her L5-S1 facet joints. She did not want to take gabapentin or Lyrica due to the side effect of weight gain. I will refill her hydrocodone that she uses up to 3 times daily but she understands that we do need pain management for assistance with long-term opiate treatment. I am going to going to the referral to pain management to get the ball rolling.

## 2022-10-21 NOTE — Assessment & Plan Note (Signed)
Periscapular pain, x-rays confirm cervical DDD, she has not x-rays, PT. Proceeding with MRI as she has had insufficient relief with conservative treatment. We will hold off on epidural injection for now.

## 2022-10-26 ENCOUNTER — Ambulatory Visit (INDEPENDENT_AMBULATORY_CARE_PROVIDER_SITE_OTHER): Payer: Medicare HMO

## 2022-10-26 DIAGNOSIS — M5030 Other cervical disc degeneration, unspecified cervical region: Secondary | ICD-10-CM | POA: Diagnosis not present

## 2022-10-26 DIAGNOSIS — M542 Cervicalgia: Secondary | ICD-10-CM | POA: Diagnosis not present

## 2022-11-11 ENCOUNTER — Ambulatory Visit
Admission: RE | Admit: 2022-11-11 | Discharge: 2022-11-11 | Disposition: A | Discharge status: Home / Self Care | Payer: Medicare HMO | Source: Ambulatory Visit | Attending: Sports Medicine | Admitting: Sports Medicine

## 2022-11-11 DIAGNOSIS — M48061 Spinal stenosis, lumbar region without neurogenic claudication: Secondary | ICD-10-CM

## 2022-11-11 DIAGNOSIS — M47816 Spondylosis without myelopathy or radiculopathy, lumbar region: Secondary | ICD-10-CM | POA: Diagnosis not present

## 2022-11-11 MED ORDER — IOPAMIDOL (ISOVUE-M 200) INJECTION 41%
1.0000 mL | Freq: Once | INTRAMUSCULAR | Status: AC
  Administered 2022-11-11: 1 mL via EPIDURAL

## 2022-11-11 MED ORDER — METHYLPREDNISOLONE ACETATE 40 MG/ML INJ SUSP (RADIOLOG
80.0000 mg | Freq: Once | INTRAMUSCULAR | Status: AC
  Administered 2022-11-11: 80 mg via EPIDURAL

## 2022-11-11 NOTE — Discharge Instructions (Signed)

## 2022-11-12 ENCOUNTER — Other Ambulatory Visit: Payer: Self-pay | Admitting: Family Medicine

## 2022-11-12 DIAGNOSIS — F5101 Primary insomnia: Secondary | ICD-10-CM

## 2022-11-17 ENCOUNTER — Encounter: Payer: Self-pay | Admitting: *Deleted

## 2022-11-25 ENCOUNTER — Ambulatory Visit
Admission: RE | Admit: 2022-11-25 | Discharge: 2022-11-25 | Disposition: A | Discharge status: Home / Self Care | Payer: Medicare HMO | Source: Ambulatory Visit | Attending: Sports Medicine | Admitting: Sports Medicine

## 2022-11-25 ENCOUNTER — Encounter: Payer: Self-pay | Admitting: Sports Medicine

## 2022-11-25 DIAGNOSIS — M48061 Spinal stenosis, lumbar region without neurogenic claudication: Secondary | ICD-10-CM

## 2022-11-25 DIAGNOSIS — M5030 Other cervical disc degeneration, unspecified cervical region: Secondary | ICD-10-CM

## 2022-11-25 DIAGNOSIS — M4722 Other spondylosis with radiculopathy, cervical region: Secondary | ICD-10-CM | POA: Diagnosis not present

## 2022-11-25 MED ORDER — IOPAMIDOL (ISOVUE-M 300) INJECTION 61%
1.0000 mL | Freq: Once | INTRAMUSCULAR | Status: AC | PRN
  Administered 2022-11-25: 1 mL via EPIDURAL

## 2022-11-25 MED ORDER — TRIAMCINOLONE ACETONIDE 40 MG/ML IJ SUSP (RADIOLOGY)
60.0000 mg | Freq: Once | INTRAMUSCULAR | Status: AC
  Administered 2022-11-25: 60 mg via EPIDURAL

## 2022-11-25 NOTE — Discharge Instructions (Signed)

## 2022-11-26 MED ORDER — HYDROCODONE-ACETAMINOPHEN 5-325 MG PO TABS
1.0000 | ORAL_TABLET | Freq: Three times a day (TID) | ORAL | 0 refills | Status: AC | PRN
Start: 2022-11-26 — End: ?

## 2022-11-26 NOTE — Telephone Encounter (Signed)
Signed off on the refill but no further refills, she has been referred to pain management.

## 2022-11-29 ENCOUNTER — Other Ambulatory Visit: Payer: Self-pay | Admitting: Family Medicine

## 2022-12-04 ENCOUNTER — Ambulatory Visit (INDEPENDENT_AMBULATORY_CARE_PROVIDER_SITE_OTHER): Payer: Medicare HMO | Admitting: Sports Medicine

## 2022-12-04 DIAGNOSIS — M5030 Other cervical disc degeneration, unspecified cervical region: Secondary | ICD-10-CM

## 2022-12-04 DIAGNOSIS — M48061 Spinal stenosis, lumbar region without neurogenic claudication: Secondary | ICD-10-CM | POA: Diagnosis not present

## 2022-12-04 NOTE — Assessment & Plan Note (Signed)
Caitlyn Hill is a pleasant 67 year old female, she has months of axial left-sided low back pain with radiation to the coccyx, left leg and the foot. MRI showed multi spondylitic processes, dominant findings were DDD L5-S1 with obvious neuroforaminal impingement, also moderate L5-S1 bilateral facet arthritis. She had 50% relief with an initial left L5-S1 interlaminar epidural, left L5-S1 interlaminar epidural #2 has provided additional relief. Intolerant of opinion, we did discuss Lyrica, she will think about it. If insufficient improvement we will consider adding Lyrica +/- pulley trigger for lumbar facet intervention. She does have an appointment coming up with Dr. Laurian Brim. Patient understands that we will not be giving her any more hydrocodone.

## 2022-12-04 NOTE — Progress Notes (Signed)
    Procedures performed today:    None.  Independent interpretation of notes and tests performed by another provider:   None.  Brief History, Exam, Impression, and Recommendations:    DDD (degenerative disc disease), cervical Periscapular pain, x-rays and MRI did confirm multilevel cervical DDD, we did order a cervical epidural, she had it about a week ago, she has had some good relief in her periscapular pain. I will order cervical epidural #2. Patient understands that we will not be giving her any more hydrocodone.  Lumbar spinal stenosis Caitlyn Hill is a pleasant 67 year old female, she has months of axial left-sided low back pain with radiation to the coccyx, left leg and the foot. MRI showed multi spondylitic processes, dominant findings were DDD L5-S1 with obvious neuroforaminal impingement, also moderate L5-S1 bilateral facet arthritis. She had 50% relief with an initial left L5-S1 interlaminar epidural, left L5-S1 interlaminar epidural #2 has provided additional relief. Intolerant of opinion, we did discuss Lyrica, she will think about it. If insufficient improvement we will consider adding Lyrica +/- pulley trigger for lumbar facet intervention. She does have an appointment coming up with Dr. Laurian Hill. Patient understands that we will not be giving her any more hydrocodone.    ____________________________________________ Ihor Austin. Benjamin Stain, M.D., ABFM., CAQSM., AME. Primary Care and Sports Medicine [facility]  Adjunct Professor of Family Medicine  [place] of [facility] of Medicine  Restaurant manager, fast food

## 2022-12-04 NOTE — Assessment & Plan Note (Addendum)
Periscapular pain, x-rays and MRI did confirm multilevel cervical DDD, we did order a cervical epidural, she had it about a week ago, she has had some good relief in her periscapular pain. I will order cervical epidural #2. Patient understands that we will not be giving her any more hydrocodone.

## 2022-12-18 DIAGNOSIS — G894 Chronic pain syndrome: Secondary | ICD-10-CM | POA: Diagnosis not present

## 2022-12-18 DIAGNOSIS — M47816 Spondylosis without myelopathy or radiculopathy, lumbar region: Secondary | ICD-10-CM | POA: Diagnosis not present

## 2022-12-18 DIAGNOSIS — M5459 Other low back pain: Secondary | ICD-10-CM | POA: Diagnosis not present

## 2022-12-18 DIAGNOSIS — M5136 Other intervertebral disc degeneration, lumbar region: Secondary | ICD-10-CM | POA: Diagnosis not present

## 2022-12-18 DIAGNOSIS — Z79891 Long term (current) use of opiate analgesic: Secondary | ICD-10-CM | POA: Diagnosis not present

## 2022-12-18 DIAGNOSIS — M533 Sacrococcygeal disorders, not elsewhere classified: Secondary | ICD-10-CM | POA: Diagnosis not present

## 2022-12-18 DIAGNOSIS — M4802 Spinal stenosis, cervical region: Secondary | ICD-10-CM | POA: Diagnosis not present

## 2022-12-18 DIAGNOSIS — M47812 Spondylosis without myelopathy or radiculopathy, cervical region: Secondary | ICD-10-CM | POA: Diagnosis not present

## 2022-12-18 DIAGNOSIS — M47814 Spondylosis without myelopathy or radiculopathy, thoracic region: Secondary | ICD-10-CM | POA: Diagnosis not present

## 2022-12-18 DIAGNOSIS — M5030 Other cervical disc degeneration, unspecified cervical region: Secondary | ICD-10-CM | POA: Diagnosis not present

## 2022-12-18 DIAGNOSIS — M4722 Other spondylosis with radiculopathy, cervical region: Secondary | ICD-10-CM | POA: Diagnosis not present

## 2022-12-18 DIAGNOSIS — M7918 Myalgia, other site: Secondary | ICD-10-CM | POA: Diagnosis not present

## 2022-12-21 ENCOUNTER — Other Ambulatory Visit: Payer: Self-pay | Admitting: Family Medicine

## 2022-12-23 ENCOUNTER — Ambulatory Visit: Payer: Medicare HMO | Admitting: Family Medicine

## 2022-12-23 ENCOUNTER — Ambulatory Visit (INDEPENDENT_AMBULATORY_CARE_PROVIDER_SITE_OTHER): Payer: Medicare HMO | Admitting: Sports Medicine

## 2022-12-23 DIAGNOSIS — M48061 Spinal stenosis, lumbar region without neurogenic claudication: Secondary | ICD-10-CM | POA: Diagnosis not present

## 2022-12-23 DIAGNOSIS — M5030 Other cervical disc degeneration, unspecified cervical region: Secondary | ICD-10-CM | POA: Diagnosis not present

## 2022-12-23 NOTE — Assessment & Plan Note (Signed)
Multilevel cervical DDD, she had her cervical epidural about a month ago at [facility] imaging, she is doing a lot better, Dr. Laurian Brim will be doing her next injection next month.

## 2022-12-23 NOTE — Assessment & Plan Note (Signed)
Pleasant 67 year old female, left-sided axial low back pain with radiation to the coccyx, left leg and foot, multiple spondylitic processes on MRI, dominant finding L5-S1 DDD, moderate bilateral L5-S1 facet arthritis. She had good relief with her first 2 left L5-S1 interlaminar epidurals, she declined Lyrica, gabapentin. We did get her in with Dr. Laurian Brim, I would like her to transition her care to Dr. Laurian Brim as this is in a closer location with regards to where she lives. I have advised she keep all of her interventional spine care in one facility rather than doing lumbar epidurals at [facility] imaging and cervical with Dr. Laurian Brim.

## 2022-12-23 NOTE — Progress Notes (Signed)
    Procedures performed today:    None.  Independent interpretation of notes and tests performed by another provider:   None.  Brief History, Exam, Impression, and Recommendations:    Lumbar spinal stenosis Pleasant 67 year old female, left-sided axial low back pain with radiation to the coccyx, left leg and foot, multiple spondylitic processes on MRI, dominant finding L5-S1 DDD, moderate bilateral L5-S1 facet arthritis. She had good relief with her first 2 left L5-S1 interlaminar epidurals, she declined Lyrica, gabapentin. We did get her in with Dr. Laurian Brim, I would like her to transition her care to Dr. Laurian Brim as this is in a closer location with regards to where she lives. I have advised she keep all of her interventional spine care in one facility rather than doing lumbar epidurals at [facility] imaging and cervical with Dr. Laurian Brim.  DDD (degenerative disc disease), cervical Multilevel cervical DDD, she had her cervical epidural about a month ago at [facility] imaging, she is doing a lot better, Dr. Laurian Brim will be doing her next injection next month.    ____________________________________________ Ihor Austin. Benjamin Stain, M.D., ABFM., CAQSM., AME. Primary Care and Sports Medicine [facility]  Adjunct Professor of Family Medicine  [place] of [facility] of Medicine  Restaurant manager, fast food

## 2023-01-03 ENCOUNTER — Other Ambulatory Visit: Payer: Self-pay | Admitting: Family Medicine

## 2023-01-21 DIAGNOSIS — M5030 Other cervical disc degeneration, unspecified cervical region: Secondary | ICD-10-CM | POA: Diagnosis not present

## 2023-01-21 DIAGNOSIS — M5412 Radiculopathy, cervical region: Secondary | ICD-10-CM | POA: Diagnosis not present

## 2023-01-21 DIAGNOSIS — M4802 Spinal stenosis, cervical region: Secondary | ICD-10-CM | POA: Diagnosis not present

## 2023-02-15 ENCOUNTER — Other Ambulatory Visit: Payer: Self-pay | Admitting: Family Medicine

## 2023-02-16 DIAGNOSIS — E785 Hyperlipidemia, unspecified: Secondary | ICD-10-CM | POA: Diagnosis not present

## 2023-02-16 DIAGNOSIS — G4700 Insomnia, unspecified: Secondary | ICD-10-CM | POA: Diagnosis not present

## 2023-02-16 DIAGNOSIS — F325 Major depressive disorder, single episode, in full remission: Secondary | ICD-10-CM | POA: Diagnosis not present

## 2023-02-16 DIAGNOSIS — Z880 Allergy status to penicillin: Secondary | ICD-10-CM | POA: Diagnosis not present

## 2023-02-16 DIAGNOSIS — I129 Hypertensive chronic kidney disease with stage 1 through stage 4 chronic kidney disease, or unspecified chronic kidney disease: Secondary | ICD-10-CM | POA: Diagnosis not present

## 2023-02-16 DIAGNOSIS — G250 Essential tremor: Secondary | ICD-10-CM | POA: Diagnosis not present

## 2023-02-16 DIAGNOSIS — Z809 Family history of malignant neoplasm, unspecified: Secondary | ICD-10-CM | POA: Diagnosis not present

## 2023-02-16 DIAGNOSIS — G43909 Migraine, unspecified, not intractable, without status migrainosus: Secondary | ICD-10-CM | POA: Diagnosis not present

## 2023-02-16 DIAGNOSIS — M8580 Other specified disorders of bone density and structure, unspecified site: Secondary | ICD-10-CM | POA: Diagnosis not present

## 2023-02-16 DIAGNOSIS — N182 Chronic kidney disease, stage 2 (mild): Secondary | ICD-10-CM | POA: Diagnosis not present

## 2023-03-09 DIAGNOSIS — M533 Sacrococcygeal disorders, not elsewhere classified: Secondary | ICD-10-CM | POA: Diagnosis not present

## 2023-03-09 DIAGNOSIS — M5134 Other intervertebral disc degeneration, thoracic region: Secondary | ICD-10-CM | POA: Diagnosis not present

## 2023-03-11 ENCOUNTER — Other Ambulatory Visit: Payer: Self-pay | Admitting: Family Medicine

## 2023-03-12 ENCOUNTER — Encounter: Payer: Self-pay | Admitting: Family Medicine

## 2023-03-12 DIAGNOSIS — Z1231 Encounter for screening mammogram for malignant neoplasm of breast: Secondary | ICD-10-CM

## 2023-03-18 ENCOUNTER — Other Ambulatory Visit: Payer: Self-pay | Admitting: Family Medicine

## 2023-03-22 ENCOUNTER — Encounter: Payer: Self-pay | Admitting: Family Medicine

## 2023-03-23 ENCOUNTER — Other Ambulatory Visit: Payer: Self-pay | Admitting: Family Medicine

## 2023-03-30 ENCOUNTER — Encounter: Payer: Self-pay | Admitting: Family Medicine

## 2023-03-30 ENCOUNTER — Ambulatory Visit (INDEPENDENT_AMBULATORY_CARE_PROVIDER_SITE_OTHER): Payer: Medicare HMO | Admitting: Family Medicine

## 2023-03-30 VITALS — BP 147/78 | HR 54 | Ht 66.5 in | Wt 135.0 lb

## 2023-03-30 DIAGNOSIS — F3341 Major depressive disorder, recurrent, in partial remission: Secondary | ICD-10-CM | POA: Diagnosis not present

## 2023-03-30 DIAGNOSIS — F5101 Primary insomnia: Secondary | ICD-10-CM | POA: Diagnosis not present

## 2023-03-30 DIAGNOSIS — R251 Tremor, unspecified: Secondary | ICD-10-CM

## 2023-03-30 DIAGNOSIS — I10 Essential (primary) hypertension: Secondary | ICD-10-CM | POA: Diagnosis not present

## 2023-04-02 NOTE — Assessment & Plan Note (Signed)
She will continue fluoxetine at current strength.  Continue trazodone as needed for insomnia.

## 2023-04-02 NOTE — Assessment & Plan Note (Signed)
Her blood pressure was a little elevated today.  She will return in a couple weeks for blood pressure recheck.

## 2023-04-02 NOTE — Assessment & Plan Note (Signed)
Continue trazodone 

## 2023-04-02 NOTE — Progress Notes (Signed)
Caitlyn Hill - 67 y.o. female MRN [id number]  Date of birth: 1956-06-05  Subjective Chief Complaint  Patient presents with   Tremors    HPI Caitlyn Hill is a 67 year old female here today for follow-up.  She reports that overall she is doing pretty well.  She is concerned about tremor.  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  She is concerned about possible implantable loop she has not noted any other symptoms related to Parkinson's.  She is requesting to see a neurologist.  She continues to do well with amlodipine for management of her hypertension.  She denies side effects to current strength.  She has not had chest pain, shortness of breath, palpitations or increasing headaches.  Tolerating atorvastatin well for hyperlipidemia.  Using combination of tizanidine and amitriptyline for management of her chronic migraines.  This is working pretty well for her.  Mood is stable with fluoxetine.  She is sleeping pretty well with trazodone.  ROS:  A comprehensive ROS was completed and negative except as noted per HPI  Allergies  Allergen Reactions   Penicillins Hives    Past Medical History:  Diagnosis Date   Allergy    Penicillin   Anxiety    Arthritis    Depression    Headache    Hyperlipidemia    Hypertension    Osteoporosis     Past Surgical History:  Procedure Laterality Date   BRAIN SURGERY     CEREBRAL MICROVASCULAR DECOMPRESSION     CESAREAN SECTION     COLONOSCOPY  06/05/2009   Caitlyn Hill   FOOT SURGERY     TRIGEMINAL NERVE DECOMPRESSION     x2    Social History   Socioeconomic History   Marital status: Divorced    Spouse name: Not on file   Number of children: 2   Years of education: 13   Highest education level: 12th grade  Occupational History   Occupation: retired  Tobacco Use   Smoking status: Former    Current packs/day: 0.00    Average packs/day: 1 pack/day for 10.0 years (10.0 ttl pk-yrs)     Types: Cigarettes, E-cigarettes    Start date: 09/05/2011    Quit date: 09/04/2021    Years since quitting: 1.5   Smokeless tobacco: Never   Tobacco comments:    I quit  Vaping Use   Vaping status: Never Used  Substance and Sexual Activity   Alcohol use: Yes    Alcohol/week: 4.0 standard drinks of alcohol    Types: 4 Standard drinks or equivalent per week   Drug use: No   Sexual activity: Yes    Birth control/protection: Post-menopausal, None  Other Topics Concern   Not on file  Social History Narrative   Lives alone. She does watch her grandson a few days a week.    Social Determinants of Health   Financial Resource Strain: Low Risk  (03/09/2023)   Received from [facility]   Overall Financial Resource Strain (CARDIA)    Difficulty of Paying Living Expenses: Not hard at all  Recent Concern: Financial Resource Strain - Medium Risk (12/20/2022)   Overall Financial Resource Strain (CARDIA)    Difficulty of Paying Living Expenses: Somewhat hard  Food Insecurity: No Food Insecurity (03/09/2023)   Received from [facility]   Hunger Vital Sign    Worried About Running Out of Food in the Last Year: Never true    Ran Out of Food in  the Last Year: Never true  Transportation Needs: No Transportation Needs (03/09/2023)   Received from [facility] - Transportation    Lack of Transportation (Medical): No    Lack of Transportation (Non-Medical): No  Physical Activity: Sufficiently Active (12/20/2022)   Exercise Vital Sign    Days of Exercise per Week: 4 days    Minutes of Exercise per Session: 40 min  Stress: Stress Concern Present (12/20/2022)   Caitlyn Hill of Occupational Health - Occupational Stress Questionnaire    Feeling of Stress : To some extent  Social Connections: Socially Isolated (12/20/2022)   Social Connection and Isolation Panel [NHANES]    Frequency of Communication with Friends and Family: More than three times a week    Frequency of Social Gatherings  with Friends and Family: Once a week    Attends Religious Services: Never    Database administrator or Organizations: No    Attends Engineer, structural: Not on file    Marital Status: Divorced    Family History  Problem Relation Age of Onset   Cancer Father        lung   Intellectual disability Father    Colon cancer Neg Hx    Esophageal cancer Neg Hx    Stomach cancer Neg Hx    Rectal cancer Neg Hx    Colon polyps Neg Hx     Health Maintenance  Topic Date Due   COVID-19 Vaccine (3 - 2023-24 season) 04/15/2023 (Originally 04/04/2022)   Medicare Annual Wellness (AWV)  04/30/2023 (Originally 02/20/2023)   Zoster Vaccines- Shingrix (1 of 2) 06/30/2023 (Originally 08/22/2003)   Pneumonia Vaccine 32+ Years old (1 of 1 - PCV) 08/14/2023 (Originally 08/21/2018)   INFLUENZA VACCINE  11/02/2023 (Originally 03/05/2023)   MAMMOGRAM  01/23/2024   DEXA SCAN  01/23/2024   Colonoscopy  10/11/2030   Hepatitis C Screening  Completed   HPV VACCINES  Aged Out   DTaP/Tdap/Td  Discontinued     ----------------------------------------------------------------------------------------------------------------------------------------------------------------------------------------------------------------- Physical Exam BP (!) 147/78 (BP Location: Left Arm, Patient Position: Sitting, Cuff Size: Normal)   Pulse (!) 54   Ht 5' 6.5" (1.689 m)   Wt 135 lb (61.2 kg)   SpO2 96%   BMI 21.46 kg/m   Physical Exam Constitutional:      Appearance: Normal appearance.  HENT:     Head: Normocephalic and atraumatic.  Cardiovascular:     Rate and Rhythm: Normal rate and regular rhythm.  Pulmonary:     Effort: Pulmonary effort is normal.     Breath sounds: Normal breath sounds.  Neurological:     Mental Status: She is alert.     Comments: No significant tremor noted at rest.  Tremor slightly increases with.  Movement.  Psychiatric:        Mood and Affect: Mood normal.        Behavior: Behavior  normal.     ------------------------------------------------------------------------------------------------------------------------------------------------------------------------------------------------------------------- Assessment and Plan  Insomnia Continue trazodone.    Recurrent major depression in partial remission (HCC)  She will continue fluoxetine at current strength.  Continue trazodone as needed for insomnia.  Essential hypertension Her blood pressure was a little elevated today.  She will return in a couple weeks for blood pressure recheck.  Tremor Symptoms are consistent with essential tremor.  She does get quite a bit of improvement with propranolol.  She would like opinion of neurologist with family history of Parkinson's.  Referral placed   No orders of the defined types  were placed in this encounter.   Return in about 4 months (around 07/30/2023) for F/u HTN.    This visit occurred during the SARS-CoV-2 public health emergency.  Safety protocols were in place, including screening questions prior to the visit, additional usage of staff PPE, and extensive cleaning of exam room while observing appropriate contact time as indicated for disinfecting solutions.

## 2023-04-03 NOTE — Assessment & Plan Note (Signed)
Symptoms are consistent with essential tremor.  She does get quite a bit of improvement with propranolol.  She would like opinion of neurologist with family history of Parkinson's.  Referral placed

## 2023-04-08 ENCOUNTER — Other Ambulatory Visit: Payer: Self-pay | Admitting: Family Medicine

## 2023-04-08 DIAGNOSIS — G250 Essential tremor: Secondary | ICD-10-CM

## 2023-04-17 ENCOUNTER — Other Ambulatory Visit: Payer: Self-pay

## 2023-04-17 MED ORDER — AMITRIPTYLINE HCL 25 MG PO TABS: ORAL_TABLET | ORAL | 0 refills | Status: DC

## 2023-04-17 MED ORDER — AMLODIPINE BESYLATE 5 MG PO TABS: 5.0000 mg | ORAL_TABLET | Freq: Every day | ORAL | 0 refills | Status: DC

## 2023-04-18 ENCOUNTER — Other Ambulatory Visit: Payer: Self-pay | Admitting: Family Medicine

## 2023-04-21 ENCOUNTER — Ambulatory Visit
Admission: RE | Admit: 2023-04-21 | Discharge: 2023-04-21 | Disposition: A | Discharge status: Home / Self Care | Payer: Medicare HMO | Source: Ambulatory Visit | Attending: Family Medicine | Admitting: Family Medicine

## 2023-04-21 DIAGNOSIS — Z1231 Encounter for screening mammogram for malignant neoplasm of breast: Secondary | ICD-10-CM | POA: Diagnosis not present

## 2023-05-16 ENCOUNTER — Other Ambulatory Visit: Payer: Self-pay | Admitting: Family Medicine

## 2023-06-03 DIAGNOSIS — M2550 Pain in unspecified joint: Secondary | ICD-10-CM | POA: Diagnosis not present

## 2023-06-03 DIAGNOSIS — M5030 Other cervical disc degeneration, unspecified cervical region: Secondary | ICD-10-CM | POA: Diagnosis not present

## 2023-06-03 DIAGNOSIS — M4802 Spinal stenosis, cervical region: Secondary | ICD-10-CM | POA: Diagnosis not present

## 2023-06-16 DIAGNOSIS — H52223 Regular astigmatism, bilateral: Secondary | ICD-10-CM | POA: Diagnosis not present

## 2023-06-16 DIAGNOSIS — Z0100 Encounter for examination of eyes and vision without abnormal findings: Secondary | ICD-10-CM | POA: Diagnosis not present

## 2023-06-27 ENCOUNTER — Other Ambulatory Visit: Payer: Self-pay | Admitting: Family Medicine

## 2023-07-14 ENCOUNTER — Ambulatory Visit: Payer: Medicare HMO | Admitting: Neurology

## 2023-07-14 ENCOUNTER — Encounter: Payer: Self-pay | Admitting: Neurology

## 2023-07-14 VITALS — BP 107/60 | HR 59 | Ht 66.0 in | Wt 136.0 lb

## 2023-07-14 DIAGNOSIS — G250 Essential tremor: Secondary | ICD-10-CM

## 2023-07-14 DIAGNOSIS — G3184 Mild cognitive impairment, so stated: Secondary | ICD-10-CM | POA: Diagnosis not present

## 2023-07-14 DIAGNOSIS — R413 Other amnesia: Secondary | ICD-10-CM

## 2023-07-14 MED ORDER — PRIMIDONE 50 MG PO TABS: 50.0000 mg | ORAL_TABLET | ORAL | 3 refills | Status: AC

## 2023-07-14 NOTE — Patient Instructions (Signed)
I had a long question the patient with regards to her tremor which sounds like benign essential tremor as well as mild subjective memory difficulties which are due to mild cognitive impairment.  I recommend further evaluation with checking memory panel labs and MRI scan of the brain.  I recommend she increase Mysoline dose to 25 mg in the morning and 50 mg at night and further as tolerated.  We also discussed memory compensation strategies and I encouraged her to increase participation in cognitively challenging activities like solving crossword puzzles, playing bridge and sudoku.  She will return for follow-up in the future in 4 months or call earlier if necessary.  Memory Compensation Strategies  Use "WARM" strategy.  W= write it down  A= associate it  R= repeat it  M= make a mental note  2.   You can keep a Glass blower/designer.  Use a 3-ring notebook with sections for the following: calendar, important names and phone numbers,  medications, doctors' names/phone numbers, lists/reminders, and a section to journal what you did  each day.   3.    Use a calendar to write appointments down.  4.    Write yourself a schedule for the day.  This can be placed on the calendar or in a separate section of the Memory Notebook.  Keeping a  regular schedule can help memory.  5.    Use medication organizer with sections for each day or morning/evening pills.  You may need help loading it  6.    Keep a basket, or pegboard by the door.  Place items that you need to take out with you in the basket or on the pegboard.  You may also want to  include a message board for reminders.  7.    Use sticky notes.  Place sticky notes with reminders in a place where the task is performed.  For example: " turn off the  stove" placed by the stove, "lock the door" placed on the door at eye level, " take your medications" on  the bathroom mirror or by the place where you normally take your medications.  8.    Use  alarms/timers.  Use while cooking to remind yourself to check on food or as a reminder to take your medicine, or as a  reminder to make a call, or as a reminder to perform another task, etc.  Essential Tremor A tremor is trembling or shaking that a person cannot control. Most tremors affect the hands or arms. Tremors can also affect the head, vocal cords, legs, and other parts of the body. Essential tremor is a tremor without a known cause. Usually, it occurs while a person is trying to perform an action. It tends to get worse gradually as a person ages. What are the causes? The cause of this condition is not known, but it often runs in families. What increases the risk? You are more likely to develop this condition if: You have a family member with essential tremor. You are 69 years of age or older. What are the signs or symptoms? The main sign of a tremor is a rhythmic shaking of certain parts of your body that is uncontrolled and unintentional. You may: Have difficulty eating with a spoon or fork. Have difficulty writing. Nod your head up and down or side to side. Have a quivering voice. The shaking may: Get worse over time. Come and go. Be more noticeable on one side of your body. Get worse due  to stress, tiredness (fatigue), caffeine, and extreme heat or cold. How is this diagnosed? This condition may be diagnosed based on: Your symptoms and medical history. A physical exam. There is no single test to diagnose an essential tremor. However, your health care provider may order tests to rule out other causes of your condition. These may include: Blood and urine tests. Imaging studies of your brain, such as a CT scan or MRI. How is this treated? Treatment for essential tremor depends on the severity of the condition. Mild tremors may not need treatment if they do not affect your day-to-day life. Severe tremors may need to be treated using one or more of the following  options: Medicines. Injections of a substance called botulinum toxin. Procedures such as deep brain stimulation (DBS) implantation or MRI-guided ultrasound treatment. Lifestyle changes. Occupational or physical therapy. Follow these instructions at home: Lifestyle  Do not use any products that contain nicotine or tobacco. These products include cigarettes, chewing tobacco, and vaping devices, such as e-cigarettes. If you need help quitting, ask your health care provider. Limit your caffeine intake as told by your health care provider. Try to get 8 hours of sleep each night. Find ways to manage your stress that fit your lifestyle and personality. Consider trying meditation or yoga. Try to anticipate stressful situations and allow extra time to manage them. If you are struggling emotionally with the effects of your tremor, consider working with a mental health provider. General instructions Take over-the-counter and prescription medicines only as told by your health care provider. Avoid extreme heat and extreme cold. Keep all follow-up visits. This is important. Visits may include physical therapy visits. Where to find more information [employer] of Neurological Disorders and Stroke: [employer] Contact a health care provider if: You experience any changes in the location or intensity of your tremors. You start having a tremor after starting a new medicine. You have a tremor with other symptoms, such as: Numbness. Tingling. Pain. Weakness. Your tremor gets worse. Your tremor interferes with your daily life. You feel down, blue, or sad for at least 2 weeks in a row. Worrying about your tremor and what other people think about you interferes with your everyday life functions, including relationships, work, or school. Summary Essential tremor is a tremor without a known cause. Usually, it occurs when you are trying to perform an action. You are more likely to develop this  condition if you have a family member with essential tremor. The main sign of a tremor is a rhythmic shaking of certain parts of your body that is uncontrolled and unintentional. Treatment for essential tremor depends on the severity of the condition. This information is not intended to replace advice given to you by your health care provider. Make sure you discuss any questions you have with your health care provider. Document Revised: 05/10/2021 Document Reviewed: 05/10/2021 Elsevier Patient Education  2024 [employer].

## 2023-07-14 NOTE — Progress Notes (Signed)
[facility] [address] [place]. [place] [address] [phone]       OFFICE CONSULT NOTE  [name] Date of Birth:  [birth year] Medical Record Number:  [id number]   Referring MD: Everrett Coombe  Reason for Referral: Tremor  HPI: [name] is a 67 year old pleasant Caucasian lady seen today for office consultation visit for tremor.  History is obtained from the patient and review of electronic medical records.  I personally reviewed pertinent available imaging films in PACS.  She has past medical history of hypertension, hyperlipidemia, depression, arthritis, anxiety and osteoporosis.  She states she has had upper extremity tremor for 6 months to a year.  Tremor is more noticeable in the left arm than the right.  It is usually absent at rest.  It is present mostly when she is using her activities like holding a cup or a newspaper in the hand.  The tremor fluctuates.  She was initially started on Inderal which she took for a while without significant benefit and she did not like the side effects and she stopped.  For the last several months she has been on Mysoline and takes only 50 mg at night which seems to help.  She is clearly noticed that when she misses a dose of Mysoline the tremor seems more prominent.  She is tolerating the current dose of Mysoline without any side effects.  She has not noticed any specific benefit of alcohol on the tremor.  She does have family history of similar tremor in her dad.  She denies any tremor involving her head, neck, voice or lower extremities.  She denies any drooling of saliva, bradykinesia, gait or balance problems or frequent falls.   ROS:   14 system review of systems is positive for hand tremor, neck pain and tightness all other systems negative  PMH:  Past Medical History:  Diagnosis Date   Allergy    Penicillin   Anxiety    Arthritis    Depression    Headache    Hyperlipidemia    Hypertension    Osteoporosis      Social History:  Social History   Socioeconomic History   Marital status: Divorced    Spouse name: Not on file   Number of children: 2   Years of education: 13   Highest education level: 12th grade  Occupational History   Occupation: retired  Tobacco Use   Smoking status: Former    Current packs/day: 0.00    Average packs/day: 1 pack/day for 10.0 years (10.0 ttl pk-yrs)    Types: Cigarettes, E-cigarettes    Start date: 09/05/2011    Quit date: 09/04/2021    Years since quitting: 1.8   Smokeless tobacco: Never   Tobacco comments:    I quit  Vaping Use   Vaping status: Never Used  Substance and Sexual Activity   Alcohol use: Yes    Alcohol/week: 5.0 standard drinks of alcohol    Types: 4 Glasses of wine, 1 Shots of liquor per week   Drug use: No   Sexual activity: Yes    Birth control/protection: Post-menopausal, None  Other Topics Concern   Not on file  Social History Narrative   Lives alone. She does watch her grandson a few days a week.    Retired    Chemical engineer Strain: Low Risk  (03/09/2023)   Received from [facility]   Overall Financial Resource Strain (CARDIA)  Difficulty of Paying Living Expenses: Not hard at all  Recent Concern: Financial Resource Strain - Medium Risk (12/20/2022)   Overall Financial Resource Strain (CARDIA)    Difficulty of Paying Living Expenses: Somewhat hard  Food Insecurity: No Food Insecurity (03/09/2023)   Received from [facility]   Hunger Vital Sign    Worried About Running Out of Food in the Last Year: Never true    Ran Out of Food in the Last Year: Never true  Transportation Needs: No Transportation Needs (03/09/2023)   Received from [facility] - Transportation    Lack of Transportation (Medical): No    Lack of Transportation (Non-Medical): No  Physical Activity: Sufficiently Active (12/20/2022)   Exercise Vital Sign    Days of Exercise per Week: 4 days    Minutes of  Exercise per Session: 40 min  Stress: Stress Concern Present (12/20/2022)   [employer] of Occupational Health - Occupational Stress Questionnaire    Feeling of Stress : To some extent  Social Connections: Socially Isolated (12/20/2022)   Social Connection and Isolation Panel [NHANES]    Frequency of Communication with Friends and Family: More than three times a week    Frequency of Social Gatherings with Friends and Family: Once a week    Attends Religious Services: Never    Database administrator or Organizations: No    Attends Engineer, structural: Not on file    Marital Status: Divorced  Intimate Partner Violence: Unknown (10/23/2022)   Received from [facility], [facility]   HITS    Physically Hurt: Not on file    Insult or Talk Down To: Not on file    Threaten Physical Harm: Not on file    Scream or Curse: Not on file    Medications:   Current Outpatient Medications on File Prior to Visit  Medication Sig Dispense Refill   alendronate (FOSAMAX) 70 MG tablet TAKE 1 TABLET (70 MG TOTAL) BY MOUTH EVERY 7 DAYS WITH FULL GLASS WATER ON EMPTY STOMACH 12 tablet 3   amLODipine (NORVASC) 5 MG tablet Take 1 tablet (5 mg total) by mouth daily. 90 tablet 0   atorvastatin (LIPITOR) 20 MG tablet TAKE 1 TABLET BY MOUTH EVERY DAY 90 tablet 3   BIOTIN PO Take by mouth.     CALCIUM PO Take by mouth.     Cyanocobalamin (VITAMIN B-12 PO) Take by mouth.     FLUoxetine (PROZAC) 20 MG capsule TAKE 2 CAPSULES BY MOUTH EVERY DAY 180 capsule 3   HYDROcodone-acetaminophen (NORCO) 5-325 MG tablet Take 1 tablet by mouth 3 (three) times daily as needed for moderate pain. 90 tablet 0   Multiple Vitamin (MULTIVITAMIN ADULT PO) Take 1 tablet by mouth.     pregabalin (LYRICA) 25 MG capsule Take 25 mg by mouth daily.     primidone (MYSOLINE) 50 MG tablet TAKE 1/2 TAB X7 DAYS THEN INCREASE TO 1 FULL TAB. MAY INCREASE BY 50MG  EACH WEEK TO A MAXIMUM OF 200MG  IF NEEDED FOR TREMOR. 360 tablet 1    traZODone (DESYREL) 50 MG tablet TAKE 1 TO 2 TABLETS BY MOUTH AT BEDTIME AS NEEDED FOR SLEEP 180 tablet 1   TURMERIC PO Take by mouth.     vitamin C (ASCORBIC ACID) 500 MG tablet Take 500 mg by mouth daily.     VITAMIN D PO Take by mouth.     VITAMIN E PO Take by mouth.     amitriptyline (ELAVIL) 25  MG tablet TAKE 1 TABLET BY MOUTH EVERYDAY AT BEDTIME. 30 tablet 2   FLUoxetine (PROZAC) 20 MG tablet TAKE 2 TABLETS BY MOUTH EVERY DAY 180 tablet 2   propranolol ER (INDERAL LA) 60 MG 24 hr capsule TAKE 1 CAPSULE BY MOUTH DAILY FOR TREMOR AND MIGRAINE PREVENTION. 90 capsule 1   tiZANidine (ZANAFLEX) 4 MG tablet TAKE 1 TABLET BY MOUTH EVERYDAY AT BEDTIME 90 tablet 1   No current facility-administered medications on file prior to visit.    Allergies:   Allergies  Allergen Reactions   Penicillins Hives    Physical Exam General: well developed, well nourished, seated, in no evident distress Head: head normocephalic and atraumatic.   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   Oriented to place and time. Recent and remote memory intact. Attention span, concentration and fund of knowledge appropriate. Mood and affect appropriate.  Intact recall 3/3.  Able to name 11 animals which can walk on 4 legs.  Clock drawing 4/4.   Cranial Nerves: Fundoscopic exam reveals sharp disc margins. Pupils equal, briskly reactive to light. Extraocular movements full without nystagmus. Visual fields full to confrontation. Hearing intact. Facial sensation intact. Face, tongue, palate moves normally and symmetrically.  Motor: Normal bulk and tone. Normal strength in all tested extremity muscles.  No resting tremor.  Mild upper extremity action tremor  increases with position holding the left arm greater than right.  Mild cogwheel rigidity at the left wrist upon activation only. Sensory.: intact to touch , pinprick , position and vibratory sensation.  Coordination: Rapid alternating movements normal in all extremities. Finger-to-nose and heel-to-shin performed accurately bilaterally. Gait and Station: Arises from chair without difficulty. Stance is normal.  Slight diminished left arm swing while walking.  Gait demonstrates normal stride length and balance . Able to heel, toe and tandem walk without difficulty.  Good postural response to threat.  Noted to patient no retropulsion. Reflexes: 1+ and symmetric. Toes downgoing.   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  She also has subjective short-term memory and cognitive difficulties likely due to mild cognitive impairment.  Remote history of tension headaches as well as right-sided trigeminal neuralgia status post left suboccipital craniotomy for trigeminal nerve microvascular decompression on 09/06/2018 by Dr. Atlee Abide.     PLAN:I had a long question the patient with regards to her tremor which sounds like benign essential tremor as well as mild subjective memory difficulties which are due to mild cognitive impairment.  I recommend further evaluation with checking memory panel labs and MRI scan of the brain.  I recommend she increase Mysoline dose to 25 mg in the morning and 50 mg at night and further as tolerated.  We also discussed memory compensation strategies and I encouraged her to increase participation in cognitively challenging activities like solving crossword puzzles, playing bridge and sudoku.  She will return for follow-up in the future in 4 months or call earlier if necessary.  Greater than 50% time during this 45-minute consultation visit was spent in  counseling and coordination of care about her essential tremor discussion about mild cognitive impairment and answering questions.  Delia Heady, MD Note: This document  was prepared with digital dictation and possible smart phrase technology. Any transcriptional errors that result from this process are unintentional.

## 2023-07-15 LAB — DEMENTIA PANEL
Homocysteine: 9.7 umol/L (ref 0.0–17.2)
RPR Ser Ql: NONREACTIVE
TSH: 1.45 u[IU]/mL (ref 0.450–4.500)
Vitamin B-12: 960 pg/mL (ref 232–1245)

## 2023-07-16 ENCOUNTER — Telehealth: Payer: Self-pay | Admitting: Neurology

## 2023-07-16 NOTE — Telephone Encounter (Signed)
sent to GI they obtain [employer] medicare auth [id number] 

## 2023-07-18 NOTE — Progress Notes (Signed)
Kindly inform the patient that lab work for reversible causes of memory loss was all satisfactory

## 2023-07-20 ENCOUNTER — Telehealth: Payer: Self-pay

## 2023-07-20 NOTE — Telephone Encounter (Signed)
I spoke with the patient and provided the results of the lab work. She verbalized understanding and expressed appreciation for the call.  She inquired about instructions for MRI that is scheduled. I told her to inform MRI technician of any metals or implants in her body. Otherwise the MRI technician will provide her with further instructions regarding MRI.  All questions answered.

## 2023-07-20 NOTE — Telephone Encounter (Signed)
-----   Message from Caitlyn Hill sent at 07/18/2023 11:50 AM EST ----- Kindly inform the patient that lab work for reversible causes of memory loss was all satisfactory

## 2023-08-03 ENCOUNTER — Ambulatory Visit: Payer: Medicare HMO | Admitting: Family Medicine

## 2023-08-09 ENCOUNTER — Other Ambulatory Visit: Payer: Self-pay | Admitting: Family Medicine

## 2023-08-10 ENCOUNTER — Ambulatory Visit: Payer: Medicare HMO | Admitting: Family Medicine

## 2023-08-11 NOTE — Telephone Encounter (Signed)
 Spoke with patient, patient will call office when she returns to [facility], thanks.

## 2023-08-11 NOTE — Telephone Encounter (Signed)
 Pls contact pt to schedule headache med refill appt. Sending 30 day refill. Thx

## 2023-08-31 ENCOUNTER — Ambulatory Visit
Admission: RE | Admit: 2023-08-31 | Discharge: 2023-08-31 | Disposition: A | Discharge status: Home / Self Care | Payer: Self-pay | Source: Ambulatory Visit | Attending: Neurology | Admitting: Neurology

## 2023-08-31 DIAGNOSIS — G250 Essential tremor: Secondary | ICD-10-CM | POA: Diagnosis not present

## 2023-08-31 DIAGNOSIS — R413 Other amnesia: Secondary | ICD-10-CM | POA: Diagnosis not present

## 2023-08-31 MED ORDER — GADOPICLENOL 0.5 MMOL/ML IV SOLN
7.5000 mL | Freq: Once | INTRAVENOUS | Status: AC | PRN
  Administered 2023-08-31: 7.5 mL via INTRAVENOUS

## 2023-09-02 NOTE — Progress Notes (Signed)
Kindly inform the patient that MRI scan of the brain shows mild age-related changes of hardening of the arteries and shrinkage of the brain.  No worrisome finding.

## 2023-09-03 DIAGNOSIS — Z79891 Long term (current) use of opiate analgesic: Secondary | ICD-10-CM | POA: Diagnosis not present

## 2023-09-03 DIAGNOSIS — M5459 Other low back pain: Secondary | ICD-10-CM | POA: Diagnosis not present

## 2023-09-03 DIAGNOSIS — M47816 Spondylosis without myelopathy or radiculopathy, lumbar region: Secondary | ICD-10-CM | POA: Diagnosis not present

## 2023-09-03 DIAGNOSIS — M2550 Pain in unspecified joint: Secondary | ICD-10-CM | POA: Diagnosis not present

## 2023-09-03 DIAGNOSIS — G894 Chronic pain syndrome: Secondary | ICD-10-CM | POA: Diagnosis not present

## 2023-09-03 DIAGNOSIS — Z5181 Encounter for therapeutic drug level monitoring: Secondary | ICD-10-CM | POA: Diagnosis not present

## 2023-09-03 DIAGNOSIS — M5134 Other intervertebral disc degeneration, thoracic region: Secondary | ICD-10-CM | POA: Diagnosis not present

## 2023-09-04 ENCOUNTER — Other Ambulatory Visit: Payer: Self-pay | Admitting: Family Medicine

## 2023-09-21 ENCOUNTER — Encounter: Payer: Self-pay | Admitting: Family Medicine

## 2023-09-21 ENCOUNTER — Other Ambulatory Visit: Payer: Self-pay | Admitting: Family Medicine

## 2023-09-22 MED ORDER — FLUOXETINE HCL 20 MG PO CAPS: 40.0000 mg | ORAL_CAPSULE | Freq: Every day | ORAL | 3 refills | Status: DC

## 2023-10-02 ENCOUNTER — Other Ambulatory Visit: Payer: Self-pay | Admitting: Family Medicine

## 2023-11-23 ENCOUNTER — Other Ambulatory Visit: Payer: Self-pay | Admitting: Sports Medicine

## 2023-11-23 ENCOUNTER — Encounter: Payer: Self-pay | Admitting: Family Medicine

## 2023-11-23 DIAGNOSIS — M48061 Spinal stenosis, lumbar region without neurogenic claudication: Secondary | ICD-10-CM

## 2023-11-23 NOTE — Telephone Encounter (Signed)
 Requesting rx rf of hydrocodone  - acet 5-325 Last written 11/26/2022 Last OV 03/30/2023 dr. Augustus Ledger  Upcoming appt none

## 2023-11-24 ENCOUNTER — Telehealth: Payer: Self-pay

## 2023-11-24 NOTE — Telephone Encounter (Signed)
Spoke with patient. Issue resolved.

## 2023-11-24 NOTE — Telephone Encounter (Signed)
 Spoke with patient . She sent the request to wrong doctor - she meant to request from her pain management doctor. She will request from this doctor instead.

## 2023-11-24 NOTE — Telephone Encounter (Signed)
Attempted call to patient. Mail box full. Could not leave a voice mail message.  

## 2023-11-24 NOTE — Telephone Encounter (Signed)
 Copied from CRM [id number]. Topic: General - Other >> Nov 24, 2023 11:57 AM Retta Caster wrote: Reason for CRM: Patient returning office call but no notes on file.  [phone]

## 2023-12-03 DIAGNOSIS — M5134 Other intervertebral disc degeneration, thoracic region: Secondary | ICD-10-CM | POA: Diagnosis not present

## 2023-12-03 DIAGNOSIS — Z79891 Long term (current) use of opiate analgesic: Secondary | ICD-10-CM | POA: Diagnosis not present

## 2023-12-03 DIAGNOSIS — Z5181 Encounter for therapeutic drug level monitoring: Secondary | ICD-10-CM | POA: Diagnosis not present

## 2023-12-03 DIAGNOSIS — G894 Chronic pain syndrome: Secondary | ICD-10-CM | POA: Diagnosis not present

## 2023-12-03 DIAGNOSIS — M25532 Pain in left wrist: Secondary | ICD-10-CM | POA: Diagnosis not present

## 2023-12-03 DIAGNOSIS — M5459 Other low back pain: Secondary | ICD-10-CM | POA: Diagnosis not present

## 2023-12-03 DIAGNOSIS — M2550 Pain in unspecified joint: Secondary | ICD-10-CM | POA: Diagnosis not present

## 2023-12-22 ENCOUNTER — Telehealth: Payer: Self-pay | Admitting: Neurology

## 2023-12-22 NOTE — Telephone Encounter (Signed)
 Appointment Cx, no longer needed

## 2023-12-25 IMAGING — MG MM DIGITAL SCREENING BILAT W/ TOMO AND CAD
8 series · 8 of 24 positions shown · non-contrast
Comparison: Previous exam(s).

CLINICAL DATA: Screening.

EXAM:
DIGITAL SCREENING BILATERAL MAMMOGRAM WITH TOMOSYNTHESIS AND CAD
TECHNIQUE: Bilateral screening digital craniocaudal and mediolateral oblique
mammograms were obtained. Bilateral screening digital breast
tomosynthesis was performed. The images were evaluated with
computer-aided detection.

[L MLO synth-2D]
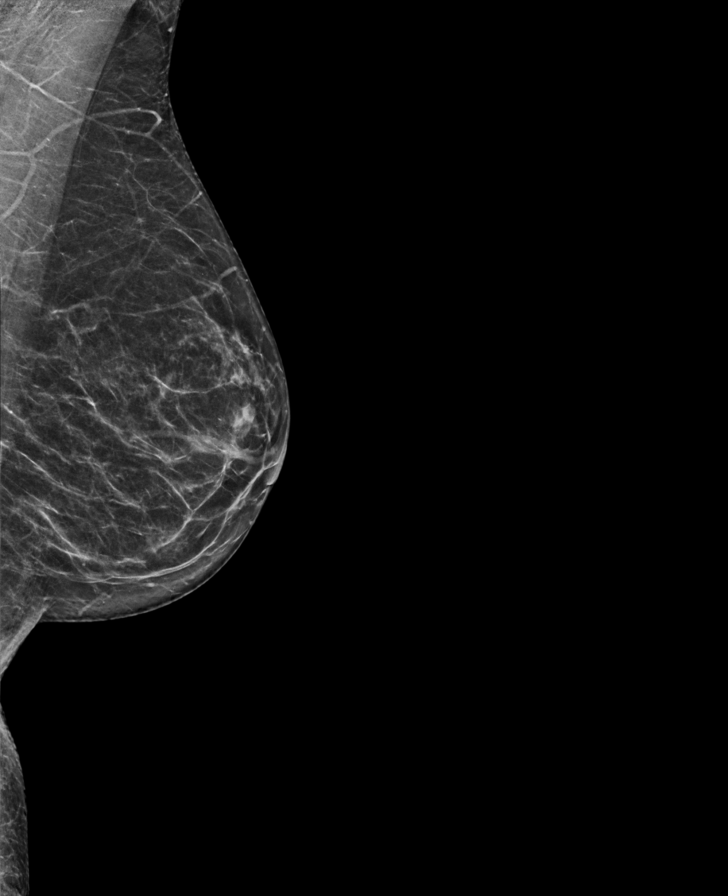

[R CC synth-2D]
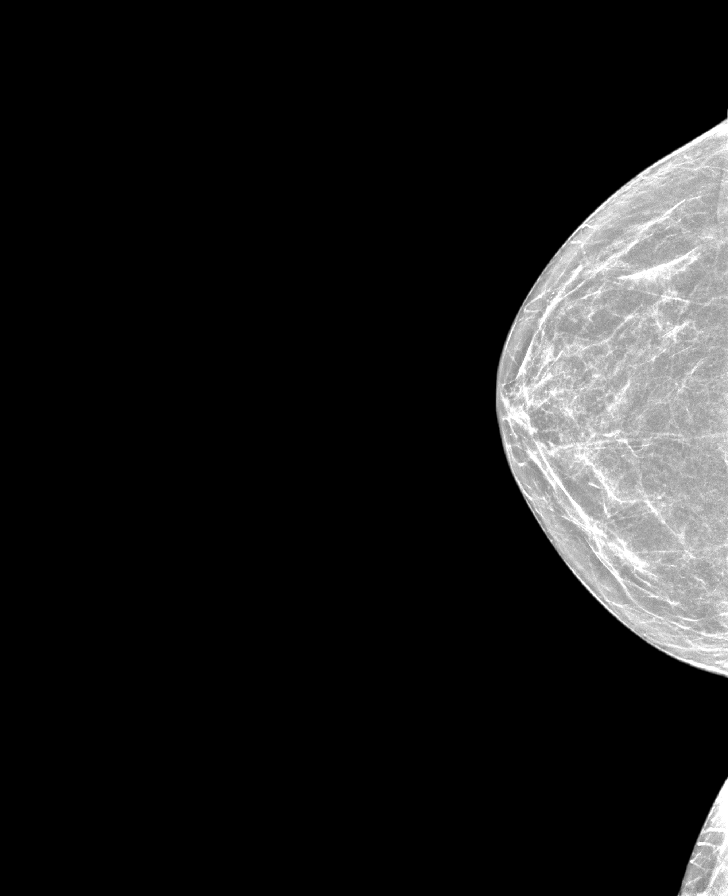

[R MLO synth-2D]
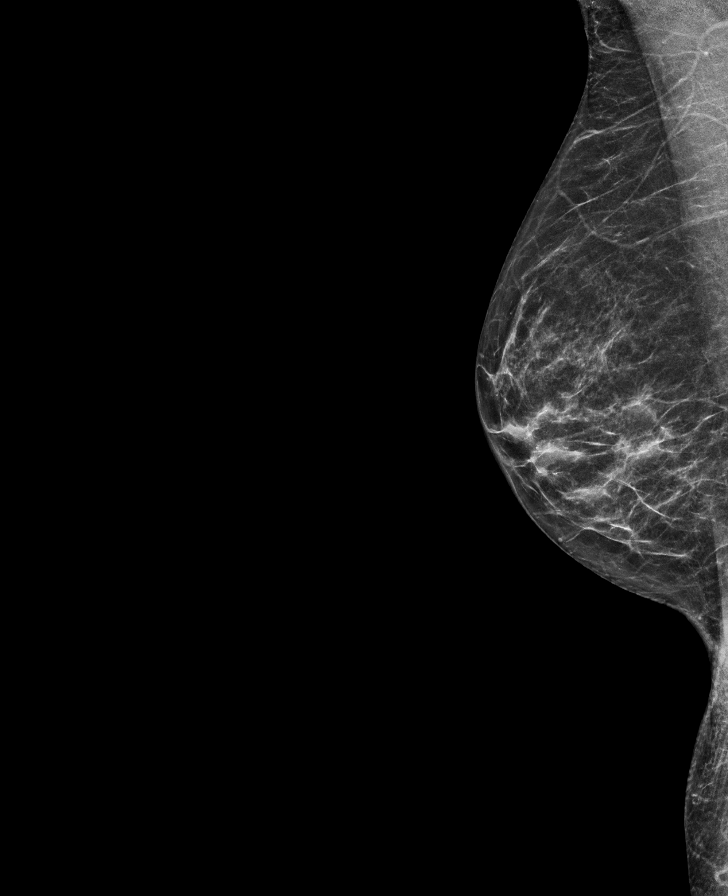

[L CC synth-2D]
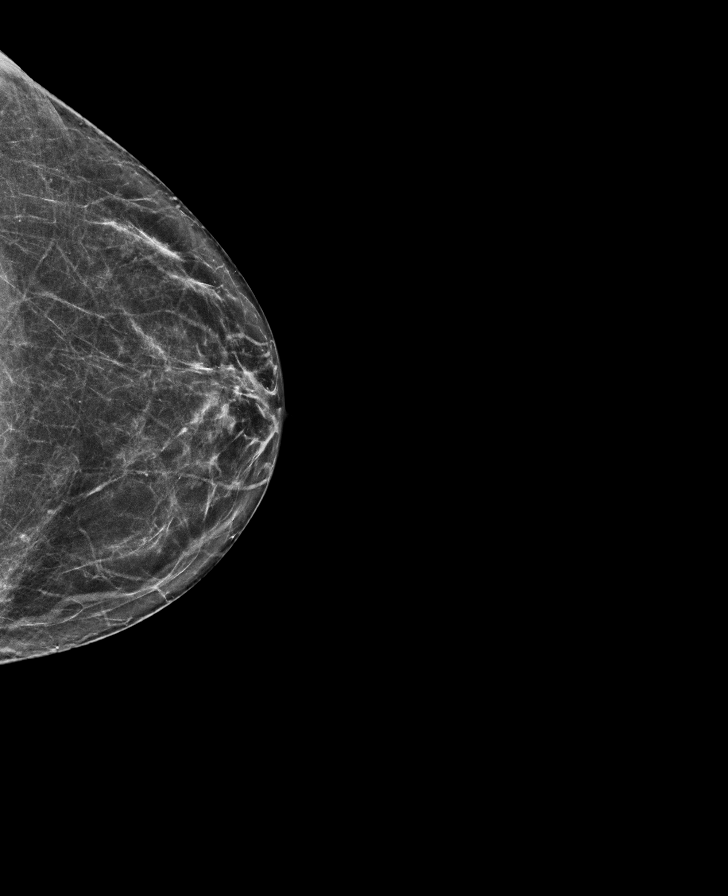

[R CC tomo · tomo slice 28/55.0]
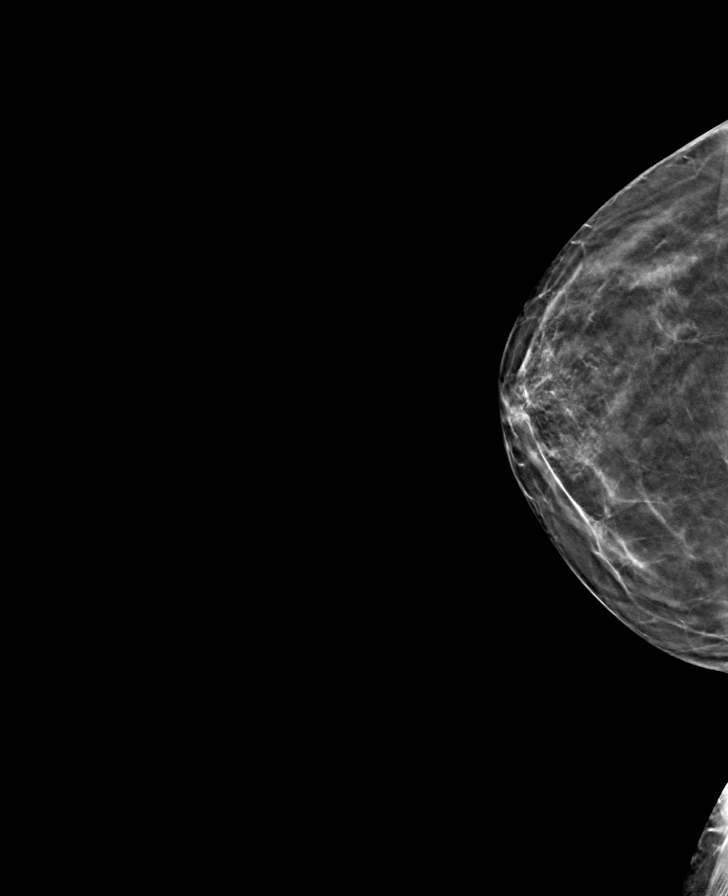

[L CC tomo · tomo slice 32/63.0]
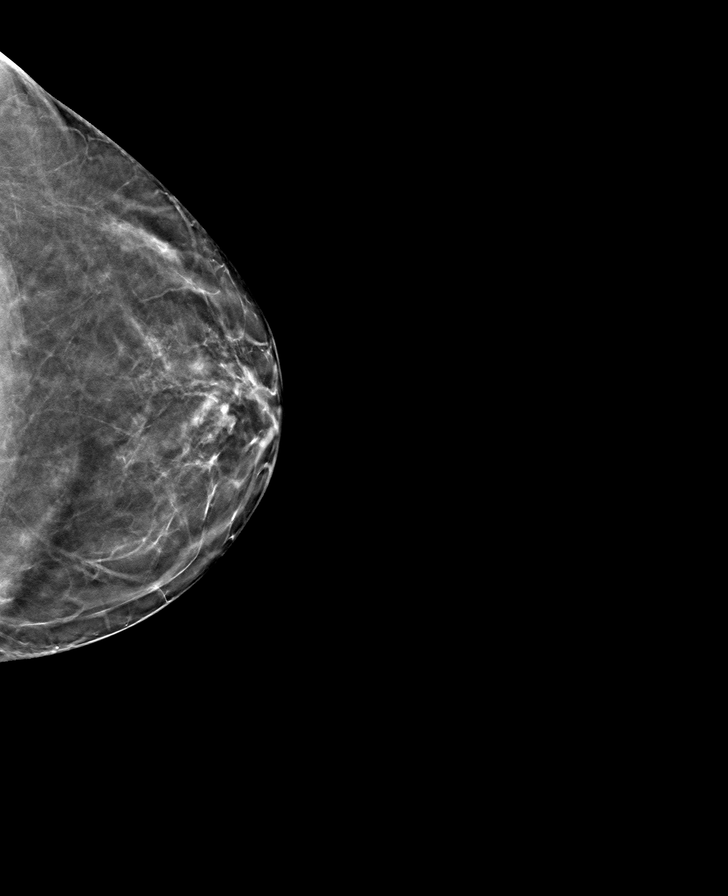

[L MLO tomo · tomo slice 29/57.0]
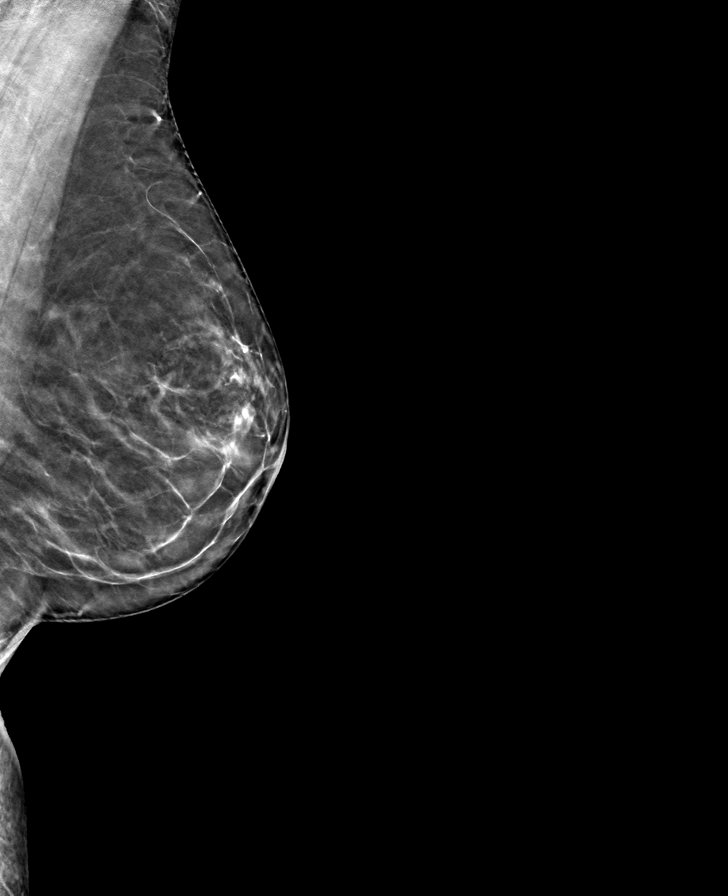

[R MLO tomo · tomo slice 28/55.0]
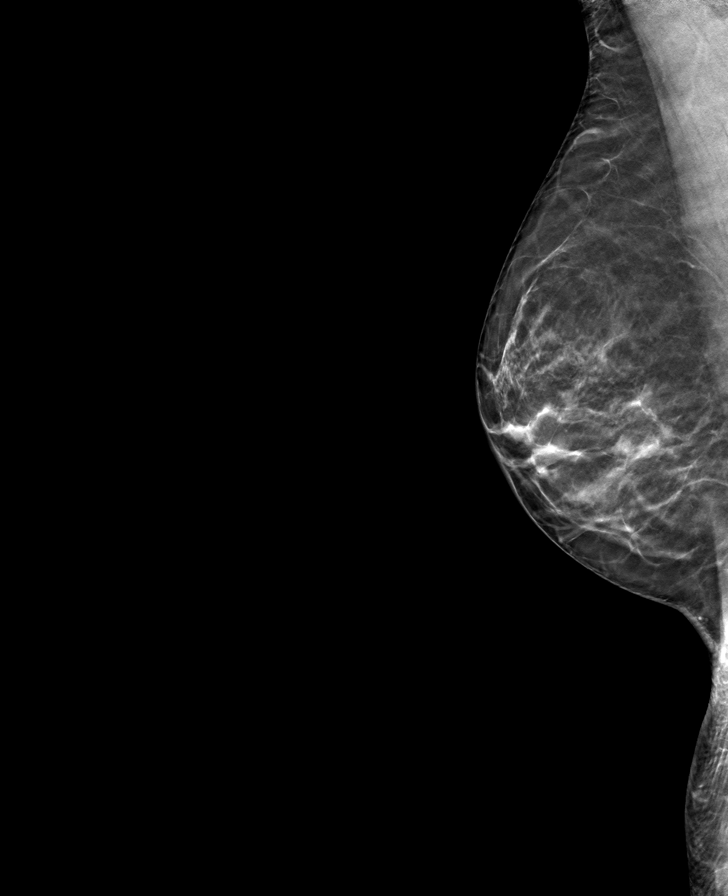

[8 of 24 positions shown; findings below may reference images not displayed]

ACR Breast Density Category b: There are scattered areas of
fibroglandular density.
FINDINGS: There are no findings suspicious for malignancy.
IMPRESSION: No mammographic evidence of malignancy. A result letter of this
screening mammogram will be mailed directly to the patient.

RECOMMENDATION:
Screening mammogram in one year. (Code:51-O-LD2)

BI-RADS CATEGORY  1: Negative.

## 2023-12-30 ENCOUNTER — Ambulatory Visit: Payer: Medicare HMO | Admitting: Family Medicine

## 2024-01-30 ENCOUNTER — Other Ambulatory Visit: Payer: Self-pay | Admitting: Family Medicine

## 2024-02-01 NOTE — Telephone Encounter (Signed)
 Scheduled

## 2024-02-01 NOTE — Telephone Encounter (Signed)
 Pls contact pt to schedule appt with Dr. Alvia for BP & bone density medication. Thx.

## 2024-02-23 ENCOUNTER — Ambulatory Visit (INDEPENDENT_AMBULATORY_CARE_PROVIDER_SITE_OTHER): Admitting: Family Medicine

## 2024-02-23 ENCOUNTER — Encounter: Payer: Self-pay | Admitting: Family Medicine

## 2024-02-23 VITALS — BP 133/69 | HR 51 | Ht 66.0 in | Wt 131.5 lb

## 2024-02-23 DIAGNOSIS — I10 Essential (primary) hypertension: Secondary | ICD-10-CM | POA: Diagnosis not present

## 2024-02-23 DIAGNOSIS — Z780 Asymptomatic menopausal state: Secondary | ICD-10-CM

## 2024-02-23 DIAGNOSIS — M48061 Spinal stenosis, lumbar region without neurogenic claudication: Secondary | ICD-10-CM

## 2024-02-23 DIAGNOSIS — Z13220 Encounter for screening for lipoid disorders: Secondary | ICD-10-CM

## 2024-02-23 DIAGNOSIS — F3341 Major depressive disorder, recurrent, in partial remission: Secondary | ICD-10-CM | POA: Diagnosis not present

## 2024-02-23 DIAGNOSIS — R251 Tremor, unspecified: Secondary | ICD-10-CM

## 2024-02-23 DIAGNOSIS — F5101 Primary insomnia: Secondary | ICD-10-CM | POA: Diagnosis not present

## 2024-02-23 NOTE — Assessment & Plan Note (Signed)
BP is fairly well controlled at this time.  Recommend continuation of current medications.

## 2024-02-23 NOTE — Assessment & Plan Note (Signed)
 Continue trazodone

## 2024-02-23 NOTE — Assessment & Plan Note (Deleted)
Her blood pressure was a little elevated today.  She will return in a couple weeks for blood pressure recheck.

## 2024-02-23 NOTE — Progress Notes (Signed)
 Caitlyn Hill - 68 y.o. female MRN [id number]  Date of birth: May 25, 1956  Subjective Chief Complaint  Patient presents with   Hypertension    HPI Caitlyn Hill is a 68 y.o. female here today for follow up visit.   She reports that she is doing pretty well.   She remains on amlodipine  for management of HTN.  She is also on propranolol  but this is more for management of her tremor and migraines.  BP is well controlled.  She denies chest pain, shortness of breath, palpitations, headache or vision changes.    Remains on primidone  as well for essential tremor.  This is working well for her.    Mood is stable with fluoxetine .  No side effect from this at current strength.  Imsomnia stable with trazodone .   Seeing pain management for chronic pain.  She is currently treated with norco and robaxin.   ROS:  A comprehensive ROS was completed and negative except as noted per HPI  Allergies  Allergen Reactions   Penicillins Hives    Past Medical History:  Diagnosis Date   Allergy    Penicillin   Anxiety    Arthritis    Depression    Headache    Hyperlipidemia    Hypertension    Osteoporosis     Past Surgical History:  Procedure Laterality Date   BRAIN SURGERY     CEREBRAL MICROVASCULAR DECOMPRESSION     CESAREAN SECTION     COLONOSCOPY  06/05/2009   [facility]   FOOT SURGERY     TRIGEMINAL NERVE DECOMPRESSION     x2    Social History   Socioeconomic History   Marital status: Divorced    Spouse name: Not on file   Number of children: 2   Years of education: 13   Highest education level: 12th grade  Occupational History   Occupation: retired  Tobacco Use   Smoking status: Former    Current packs/day: 0.00    Average packs/day: 1 pack/day for 10.0 years (10.0 ttl pk-yrs)    Types: Cigarettes, E-cigarettes    Start date: 09/05/2011    Quit date: 09/04/2021    Years since quitting: 2.4   Smokeless tobacco: Never   Tobacco comments:    I quit  Vaping Use   Vaping  status: Never Used  Substance and Sexual Activity   Alcohol use: Yes    Alcohol/week: 5.0 standard drinks of alcohol    Types: 4 Glasses of wine, 1 Shots of liquor per week   Drug use: No   Sexual activity: Yes    Birth control/protection: Post-menopausal, None  Other Topics Concern   Not on file  Social History Narrative   Lives alone. She does watch her grandson a few days a week.    Retired    Teacher, early years/pre Strain: Low Risk  (02/19/2024)   Overall Financial Resource Strain (CARDIA)    Difficulty of Paying Living Expenses: Not very hard  Food Insecurity: No Food Insecurity (02/19/2024)   Hunger Vital Sign    Worried About Running Out of Food in the Last Year: Never true    Ran Out of Food in the Last Year: Never true  Transportation Needs: No Transportation Needs (02/19/2024)   PRAPARE - Administrator, Civil Service (Medical): No    Lack of Transportation (Non-Medical): No  Physical Activity: Sufficiently Active (02/19/2024)   Exercise Vital Sign    Days of  Exercise per Week: 5 days    Minutes of Exercise per Session: 40 min  Stress: No Stress Concern Present (02/19/2024)   [employer] of Occupational Health - Occupational Stress Questionnaire    Feeling of Stress: Only a little  Social Connections: Moderately Integrated (02/19/2024)   Social Connection and Isolation Panel    Frequency of Communication with Friends and Family: More than three times a week    Frequency of Social Gatherings with Friends and Family: Once a week    Attends Religious Services: 1 to 4 times per year    Active Member of [employer] or Organizations: Yes    Attends Banker Meetings: 1 to 4 times per year    Marital Status: Divorced    Family History  Problem Relation Age of Onset   Cancer Father        lung   Intellectual disability Father    Parkinson's disease Other    Colon cancer Neg Hx    Esophageal cancer Neg Hx    Stomach cancer  Neg Hx    Rectal cancer Neg Hx    Colon polyps Neg Hx     Health Maintenance  Topic Date Due   Medicare Annual Wellness (AWV)  02/20/2023   COVID-19 Vaccine (3 - 2024-25 season) 04/05/2023   DEXA SCAN  01/23/2024   Pneumococcal Vaccine: 50+ Years (1 of 1 - PCV) 02/22/2025 (Originally 08/22/2003)   Zoster Vaccines- Shingrix (1 of 2) 05/25/2025 (Originally 08/22/2003)   INFLUENZA VACCINE  03/04/2024   MAMMOGRAM  04/20/2025   Colonoscopy  10/11/2030   Hepatitis C Screening  Completed   Hepatitis B Vaccines  Aged Out   HPV VACCINES  Aged Out   Meningococcal B Vaccine  Aged Out   DTaP/Tdap/Td  Discontinued     ----------------------------------------------------------------------------------------------------------------------------------------------------------------------------------------------------------------- Physical Exam BP 133/69   Pulse (!) 51   Ht 5' 6 (1.676 m)   Wt 131 lb 8 oz (59.6 kg)   SpO2 97%   BMI 21.22 kg/m   Physical Exam Constitutional:      Appearance: Normal appearance.  HENT:     Head: Normocephalic and atraumatic.  Cardiovascular:     Rate and Rhythm: Normal rate and regular rhythm.  Pulmonary:     Effort: Pulmonary effort is normal.     Breath sounds: Normal breath sounds.  Neurological:     General: No focal deficit present.     Mental Status: She is alert.  Psychiatric:        Mood and Affect: Mood normal.        Behavior: Behavior normal.     ------------------------------------------------------------------------------------------------------------------------------------------------------------------------------------------------------------------- Assessment and Plan  Insomnia Continue trazodone .    Tremor Symptoms are consistent with essential tremor.  She does get quite a bit of improvement with propranolol .  Primidone  added as well by neuro.  Overall stable at this time.   Lumbar spinal stenosis Seeing spine specialist for  pain management.  Stable with norco prn and robaxin  Recurrent major depression in partial remission (HCC)  She will continue fluoxetine  at current strength.  Continue trazodone  as needed for insomnia.  Essential hypertension BP is fairly well controlled at this time.  Recommend continuation of current medications.     No orders of the defined types were placed in this encounter.   Return in about 6 months (around 08/25/2024) for Hypertension.

## 2024-02-23 NOTE — Assessment & Plan Note (Signed)
 Symptoms are consistent with essential tremor.  She does get quite a bit of improvement with propranolol .  Primidone  added as well by neuro.  Overall stable at this time.

## 2024-02-23 NOTE — Assessment & Plan Note (Signed)
She will continue fluoxetine at current strength.  Continue trazodone as needed for insomnia.

## 2024-02-23 NOTE — Assessment & Plan Note (Signed)
 Seeing spine specialist for pain management.  Stable with norco prn and robaxin

## 2024-02-24 LAB — CBC WITH DIFFERENTIAL/PLATELET
Basophils Absolute: 0.1 x10E3/uL (ref 0.0–0.2)
Basos: 1 %
EOS (ABSOLUTE): 0.2 x10E3/uL (ref 0.0–0.4)
Eos: 3 %
Hematocrit: 41.3 % (ref 34.0–46.6)
Hemoglobin: 13.4 g/dL (ref 11.1–15.9)
Immature Grans (Abs): 0 x10E3/uL (ref 0.0–0.1)
Immature Granulocytes: 0 %
Lymphocytes Absolute: 3.8 x10E3/uL — ABNORMAL HIGH (ref 0.7–3.1)
Lymphs: 50 %
MCH: 30.8 pg (ref 26.6–33.0)
MCHC: 32.4 g/dL (ref 31.5–35.7)
MCV: 95 fL (ref 79–97)
Monocytes Absolute: 0.8 x10E3/uL (ref 0.1–0.9)
Monocytes: 10 %
Neutrophils Absolute: 2.8 x10E3/uL (ref 1.4–7.0)
Neutrophils: 36 %
Platelets: 223 x10E3/uL (ref 150–450)
RBC: 4.35 x10E6/uL (ref 3.77–5.28)
RDW: 12.2 % (ref 11.7–15.4)
WBC: 7.6 x10E3/uL (ref 3.4–10.8)

## 2024-02-24 LAB — CMP14+EGFR
ALT: 29 IU/L (ref 0–32)
AST: 28 IU/L (ref 0–40)
Albumin: 4.7 g/dL (ref 3.9–4.9)
Alkaline Phosphatase: 89 IU/L (ref 44–121)
BUN/Creatinine Ratio: 13 (ref 12–28)
BUN: 11 mg/dL (ref 8–27)
Bilirubin Total: 0.5 mg/dL (ref 0.0–1.2)
CO2: 24 mmol/L (ref 20–29)
Calcium: 9.9 mg/dL (ref 8.7–10.3)
Chloride: 97 mmol/L (ref 96–106)
Creatinine, Ser: 0.87 mg/dL (ref 0.57–1.00)
Globulin, Total: 1.9 g/dL (ref 1.5–4.5)
Glucose: 96 mg/dL (ref 70–99)
Potassium: 4.6 mmol/L (ref 3.5–5.2)
Sodium: 137 mmol/L (ref 134–144)
Total Protein: 6.6 g/dL (ref 6.0–8.5)
eGFR: 72 mL/min/1.73 (ref >59)

## 2024-02-24 LAB — LIPID PANEL WITH LDL/HDL RATIO
Cholesterol, Total: 141 mg/dL (ref 100–199)
HDL: 66 mg/dL (ref >39)
LDL Chol Calc (NIH): 62 mg/dL (ref 0–99)
LDL/HDL Ratio: 0.9 ratio (ref 0.0–3.2)
Triglycerides: 66 mg/dL (ref 0–149)
VLDL Cholesterol Cal: 13 mg/dL (ref 5–40)

## 2024-03-03 ENCOUNTER — Ambulatory Visit: Payer: Self-pay | Admitting: Family Medicine

## 2024-03-03 DIAGNOSIS — R5383 Other fatigue: Secondary | ICD-10-CM

## 2024-03-03 NOTE — Progress Notes (Signed)
 Hi [name], I am covering for Dr. Alvia while he is out of the office.  Your metabolic panel and your blood count look great.  Cholesterol is at goal which is fantastic!  Great work!

## 2024-03-07 DIAGNOSIS — Z822 Family history of deafness and hearing loss: Secondary | ICD-10-CM | POA: Diagnosis not present

## 2024-03-07 DIAGNOSIS — H903 Sensorineural hearing loss, bilateral: Secondary | ICD-10-CM | POA: Diagnosis not present

## 2024-03-11 ENCOUNTER — Other Ambulatory Visit: Payer: Self-pay | Admitting: Family Medicine

## 2024-03-11 NOTE — Progress Notes (Signed)
 We can check her iron with labs if she would like with an iron panel

## 2024-03-21 ENCOUNTER — Ambulatory Visit: Payer: Medicare HMO | Admitting: Neurology

## 2024-03-21 ENCOUNTER — Other Ambulatory Visit: Payer: Self-pay | Admitting: Family Medicine

## 2024-04-01 DIAGNOSIS — H52223 Regular astigmatism, bilateral: Secondary | ICD-10-CM | POA: Diagnosis not present

## 2024-04-05 ENCOUNTER — Encounter: Payer: Self-pay | Admitting: Sports Medicine

## 2024-04-27 ENCOUNTER — Ambulatory Visit

## 2024-04-27 DIAGNOSIS — Z13820 Encounter for screening for osteoporosis: Secondary | ICD-10-CM | POA: Diagnosis not present

## 2024-04-27 DIAGNOSIS — M810 Age-related osteoporosis without current pathological fracture: Secondary | ICD-10-CM | POA: Diagnosis not present

## 2024-04-27 DIAGNOSIS — Z780 Asymptomatic menopausal state: Secondary | ICD-10-CM | POA: Diagnosis not present

## 2024-06-16 ENCOUNTER — Other Ambulatory Visit: Payer: Self-pay | Admitting: Family Medicine

## 2024-07-07 ENCOUNTER — Other Ambulatory Visit: Payer: Self-pay | Admitting: Neurology

## 2024-07-07 NOTE — Telephone Encounter (Signed)
 Last seen on 07/14/23 per note  She will return for follow-up in the future in 4 months or call earlier if necessary.    No follow up scheduled, was scheduled but cancelled stated no longer needed

## 2024-07-21 NOTE — Addendum Note (Signed)
 Addended by: Tynetta Bachmann Z on: 07/21/2024 01:28 PM   Modules accepted: Orders

## 2024-08-25 ENCOUNTER — Ambulatory Visit: Admitting: Family Medicine

## 2024-08-25 ENCOUNTER — Encounter: Payer: Self-pay | Admitting: Family Medicine

## 2024-08-25 VITALS — BP 159/83 | HR 62 | Ht 66.0 in | Wt 129.0 lb

## 2024-08-25 DIAGNOSIS — M48061 Spinal stenosis, lumbar region without neurogenic claudication: Secondary | ICD-10-CM

## 2024-08-25 DIAGNOSIS — I10 Essential (primary) hypertension: Secondary | ICD-10-CM

## 2024-08-25 DIAGNOSIS — R251 Tremor, unspecified: Secondary | ICD-10-CM

## 2024-08-25 DIAGNOSIS — F411 Generalized anxiety disorder: Secondary | ICD-10-CM | POA: Diagnosis not present

## 2024-08-25 DIAGNOSIS — F5101 Primary insomnia: Secondary | ICD-10-CM

## 2024-08-25 MED ORDER — TRAZODONE HCL 50 MG PO TABS: 50.0000 mg | ORAL_TABLET | Freq: Every evening | ORAL | 1 refills | Status: AC | PRN

## 2024-08-25 MED ORDER — AMLODIPINE BESYLATE 5 MG PO TABS: 5.0000 mg | ORAL_TABLET | Freq: Every day | ORAL | 0 refills | Status: AC

## 2024-08-25 NOTE — Assessment & Plan Note (Signed)
Continue fluoxetine at current strength. 

## 2024-08-25 NOTE — Assessment & Plan Note (Signed)
 Seeing spine specialist for pain management.  Stable with norco prn and Lyrica .

## 2024-08-25 NOTE — Assessment & Plan Note (Signed)
 Blood pressure is elevated.  Will plan to restart amlodipine .  Follow-up in 2 to 3 weeks for blood pressure recheck.

## 2024-08-25 NOTE — Assessment & Plan Note (Signed)
 Currently taking primidone .  This is working pretty well for her current strength.  This is being managed by neurology.

## 2024-08-25 NOTE — Progress Notes (Signed)
 " Caitlyn Hill - 69 y.o. female MRN [id number]  Date of birth: 30-Sep-1955  Subjective Chief Complaint  Patient presents with   Hypertension    HPI Caitlyn Hill is a 69 y.o. female here today for follow up visit.   She reports that she is doing ok.   History of HTN.  BP elevated today but she is not taking amlodipine .  She is willing to restart this.  No side effects at this time.   Continues on fluoxetine  which is working well for her. No side effects at this time.  Trazodone  effective for insomnia as needed.  Essential tremor treated withand primidone .  This is being managed by neurology.  This is working well for her.   Seeing pain management ofr low back pain.  Rx for norco and lyrica .  Stable on these at current strength.   ROS:  A comprehensive ROS was completed and negative except as noted per HPI  Allergies[1]  Past Medical History:  Diagnosis Date   Allergy    Penicillin   Anxiety    Arthritis    Depression    Headache    Hyperlipidemia    Hypertension    Osteoporosis     Past Surgical History:  Procedure Laterality Date   BRAIN SURGERY     CEREBRAL MICROVASCULAR DECOMPRESSION     CESAREAN SECTION     COLONOSCOPY  06/05/2009   [facility]   FOOT SURGERY     TRIGEMINAL NERVE DECOMPRESSION     x2    Social History   Socioeconomic History   Marital status: Divorced    Spouse name: Not on file   Number of children: 2   Years of education: 13   Highest education level: 12th grade  Occupational History   Occupation: retired  Tobacco Use   Smoking status: Former    Current packs/day: 0.00    Average packs/day: 1 pack/day for 10.0 years (10.0 ttl pk-yrs)    Types: Cigarettes, E-cigarettes    Start date: 09/05/2011    Quit date: 09/04/2021    Years since quitting: 2.9   Smokeless tobacco: Never   Tobacco comments:    I quit  Vaping Use   Vaping status: Never Used  Substance and Sexual Activity   Alcohol use: Yes    Alcohol/week: 5.0 standard drinks of  alcohol    Types: 4 Glasses of wine, 1 Shots of liquor per week   Drug use: No   Sexual activity: Yes    Birth control/protection: Post-menopausal, None  Other Topics Concern   Not on file  Social History Narrative   Lives alone. She does watch her grandson a few days a week.    Retired    [employer]   Tobacco Use: Medium Risk (02/23/2024)   Patient History    Smoking Tobacco Use: Former    Smokeless Tobacco Use: Never    Passive Exposure: Not on file  Financial Resource Strain: Low Risk (02/19/2024)   Overall Financial Resource Strain (CARDIA)    Difficulty of Paying Living Expenses: Not very hard  Food Insecurity: No Food Insecurity (02/19/2024)   Epic    Worried About Programme Researcher, Broadcasting/film/video in the Last Year: Never true    Ran Out of Food in the Last Year: Never true  Transportation Needs: No Transportation Needs (02/19/2024)   Epic    Lack of Transportation (Medical): No    Lack of Transportation (Non-Medical): No  Physical Activity: Sufficiently Active (  02/19/2024)   Exercise Vital Sign    Days of Exercise per Week: 5 days    Minutes of Exercise per Session: 40 min  Stress: No Stress Concern Present (02/19/2024)   [employer] of Occupational [employer] - Occupational Stress Questionnaire    Feeling of Stress: Only a little  Social Connections: Moderately Integrated (02/19/2024)   Social Connection and Isolation Panel    Frequency of Communication with Friends and Family: More than three times a week    Frequency of Social Gatherings with Friends and Family: Once a week    Attends Religious Services: 1 to 4 times per year    Active Member of Clubs or Organizations: Yes    Attends Banker Meetings: 1 to 4 times per year    Marital Status: Divorced  Depression (PHQ2-9): Low Risk (08/25/2024)   Depression (PHQ2-9)    PHQ-2 Score: 2  Alcohol Screen: Low Risk (02/19/2024)   Alcohol Screen    Last Alcohol Screening Score (AUDIT): 4  Housing: Low Risk  (02/19/2024)   Epic    Unable to Pay for Housing in the Last Year: No    Number of Times Moved in the Last Year: 0    Homeless in the Last Year: No  Utilities: Not At Risk (03/09/2023)   Received from [facility] Utilities    Threatened with loss of utilities: No  Health Literacy: Not on file    Family History  Problem Relation Age of Onset   Cancer Father        lung   Intellectual disability Father    Parkinson's disease Other    Colon cancer Neg Hx    Esophageal cancer Neg Hx    Stomach cancer Neg Hx    Rectal cancer Neg Hx    Colon polyps Neg Hx     Health Maintenance  Topic Date Due   Medicare Annual Wellness (AWV)  02/20/2023   Influenza Vaccine  11/01/2024 (Originally 03/04/2024)   Pneumococcal Vaccine: 50+ Years (1 of 1 - PCV) 02/22/2025 (Originally 08/22/2003)   COVID-19 Vaccine (3 - 2025-26 season) 04/03/2025 (Originally 04/04/2024)   Zoster Vaccines- Shingrix (1 of 2) 05/25/2025 (Originally 08/22/2003)   Mammogram  04/20/2025   Bone Density Scan  04/27/2026   Colonoscopy  10/11/2030   Hepatitis C Screening  Completed   Meningococcal B Vaccine  Aged Out   DTaP/Tdap/Td  Discontinued     ----------------------------------------------------------------------------------------------------------------------------------------------------------------------------------------------------------------- Physical Exam BP (!) 159/83   Pulse 62   Ht 5' 6 (1.676 m)   Wt 129 lb (58.5 kg)   SpO2 96%   BMI 20.82 kg/m   Physical Exam Constitutional:      Appearance: Normal appearance.  Eyes:     General: No scleral icterus. Cardiovascular:     Rate and Rhythm: Normal rate and regular rhythm.  Pulmonary:     Effort: Pulmonary effort is normal.     Breath sounds: Normal breath sounds.  Musculoskeletal:     Cervical back: Neck supple.  Neurological:     Mental Status: She is alert.  Psychiatric:        Mood and Affect: Mood normal.        Behavior: Behavior  normal.     ------------------------------------------------------------------------------------------------------------------------------------------------------------------------------------------------------------------- Assessment and Plan  Insomnia Continue trazodone .    Tremor Currently taking primidone .  This is working pretty well for her current strength.  This is being managed by neurology.  Generalized anxiety disorder Continue fluoxetine  at current strength.  Lumbar spinal stenosis Seeing spine specialist for pain management.  Stable with norco prn and Lyrica .   Essential hypertension Blood pressure is elevated.  Will plan to restart amlodipine .  Follow-up in 2 to 3 weeks for blood pressure recheck.   Meds ordered this encounter  Medications   amLODipine  (NORVASC ) 5 MG tablet    Sig: Take 1 tablet (5 mg total) by mouth daily.    Dispense:  90 tablet    Refill:  0   traZODone  (DESYREL ) 50 MG tablet    Sig: Take 1-2 tablets (50-100 mg total) by mouth at bedtime as needed. for sleep    Dispense:  180 tablet    Refill:  1    Return in about 2 weeks (around 09/08/2024) for nurse visit for BP check.         [1]  Allergies Allergen Reactions   Penicillins Hives   "

## 2024-08-25 NOTE — Assessment & Plan Note (Signed)
 Continue trazodone 

## 2024-09-01 ENCOUNTER — Encounter: Payer: Self-pay | Admitting: Family Medicine

## 2024-09-08 ENCOUNTER — Ambulatory Visit

## 2025-02-23 ENCOUNTER — Ambulatory Visit: Admitting: Family Medicine
